# Patient Record
Sex: Female | Born: 1985 | Race: Black or African American | Hispanic: Yes | Smoking: Never smoker
Health system: Southern US, Community
[De-identification: ages and names within clinical notes are randomized; demographics above are authoritative.]

## PROBLEM LIST (undated history)

## (undated) DIAGNOSIS — K219 Gastro-esophageal reflux disease without esophagitis: Secondary | ICD-10-CM

## (undated) DIAGNOSIS — T7840XA Allergy, unspecified, initial encounter: Secondary | ICD-10-CM

## (undated) HISTORY — PX: EYE SURGERY: SHX253

## (undated) HISTORY — PX: NO PAST SURGERIES: SHX2092

## (undated) HISTORY — DX: Gastro-esophageal reflux disease without esophagitis: K21.9

## (undated) HISTORY — DX: Allergy, unspecified, initial encounter: T78.40XA

## (undated) HISTORY — PX: RETINAL DETACHMENT SURGERY: SHX105

---

## 2013-05-12 ENCOUNTER — Encounter: Payer: Self-pay | Admitting: Family

## 2013-05-12 ENCOUNTER — Ambulatory Visit (INDEPENDENT_AMBULATORY_CARE_PROVIDER_SITE_OTHER): Payer: 59 | Admitting: Family

## 2013-05-12 VITALS — BP 110/80 | HR 85 | Ht 64.5 in | Wt 225.0 lb

## 2013-05-12 DIAGNOSIS — Z0000 Encounter for general adult medical examination without abnormal findings: Secondary | ICD-10-CM

## 2013-05-12 DIAGNOSIS — E559 Vitamin D deficiency, unspecified: Secondary | ICD-10-CM | POA: Insufficient documentation

## 2013-05-12 LAB — POCT URINALYSIS DIPSTICK
Blood, UA: NEGATIVE
Ketones, UA: NEGATIVE
Protein, UA: NEGATIVE
Spec Grav, UA: 1.02
Urobilinogen, UA: 0.2
pH, UA: 6.5

## 2013-05-12 LAB — CBC WITH DIFFERENTIAL/PLATELET
Eosinophils Absolute: 0 10*3/uL (ref 0.0–0.7)
Eosinophils Relative: 0.5 % (ref 0.0–5.0)
Lymphocytes Relative: 34.5 % (ref 12.0–46.0)
Monocytes Relative: 7.3 % (ref 3.0–12.0)
Neutrophils Relative %: 57.4 % (ref 43.0–77.0)
Platelets: 237 10*3/uL (ref 150.0–400.0)
RDW: 14.1 % (ref 11.5–14.6)
WBC: 4.1 10*3/uL — ABNORMAL LOW (ref 4.5–10.5)

## 2013-05-12 LAB — COMPREHENSIVE METABOLIC PANEL
Albumin: 3.6 g/dL (ref 3.5–5.2)
Alkaline Phosphatase: 77 U/L (ref 39–117)
BUN: 9 mg/dL (ref 6–23)
CO2: 27 mEq/L (ref 19–32)
Calcium: 9.2 mg/dL (ref 8.4–10.5)
GFR: 123.68 mL/min (ref >60.00)
Glucose, Bld: 82 mg/dL (ref 70–99)
Potassium: 4.2 mEq/L (ref 3.5–5.1)
Sodium: 136 mEq/L (ref 135–145)
Total Bilirubin: 1.1 mg/dL (ref 0.3–1.2)
Total Protein: 7.5 g/dL (ref 6.0–8.3)

## 2013-05-12 LAB — TSH: TSH: 0.66 u[IU]/mL (ref 0.35–5.50)

## 2013-05-12 LAB — LIPID PANEL
HDL: 53.1 mg/dL (ref >39.00)
LDL Cholesterol: 93 mg/dL (ref 0–99)
Total CHOL/HDL Ratio: 3
VLDL: 10.6 mg/dL (ref 0.0–40.0)

## 2013-05-12 NOTE — Progress Notes (Signed)
  Subjective:    Patient ID: Savannah Holland, female    DOB: May 09, 1986, 27 y.o.   MRN: [id number]  HPI 27 year old [place] American female, new patient to the practice and to be established. She denies any concerns today. She sees gynecology for her female exams. Has a history of vitamin D deficiency and was prescribed medication for gynecology but has not taken the medication. She does not exercise routinely but tries to follow a healthy diet and considering Weight Watchers.   Review of Systems  Constitutional: Negative.   HENT: Negative.   Eyes: Negative.   Respiratory: Negative.   Cardiovascular: Negative.   Gastrointestinal: Negative.   Endocrine: Negative.   Genitourinary: Negative.   Musculoskeletal: Negative.   Skin: Negative.   Allergic/Immunologic: Negative.   Neurological: Negative.   Hematological: Negative.   Psychiatric/Behavioral: Negative.    Past Medical History  Diagnosis Date  . Allergy   . GERD (gastroesophageal reflux disease)     History   Social History  . Marital Status: Married    Spouse Name: N/A    Number of Children: N/A  . Years of Education: N/A   Occupational History  . Not on file.   Social History Main Topics  . Smoking status: Never Smoker   . Smokeless tobacco: Not on file  . Alcohol Use: Yes  . Drug Use: No  . Sexual Activity: Not on file   Other Topics Concern  . Not on file   Social History Narrative  . No narrative on file    History reviewed. No pertinent past surgical history.  Family History  Problem Relation Age of Onset  . Fibroids Mother   . Hyperlipidemia Father   . GER disease Father   . Prostate cancer Father     No Known Allergies  No current outpatient prescriptions on file prior to visit.   No current facility-administered medications on file prior to visit.    BP 110/80  Pulse 85  Ht 5' 4.5" (1.638 m)  Wt 225 lb (102.059 kg)  BMI 38.04 kg/m2  LMP 11/01/2014chart    Objective:   Physical  Exam  Constitutional: She is oriented to person, place, and time. She appears well-developed and well-nourished.  HENT:  Head: Normocephalic and atraumatic.  Right Ear: External ear normal.  Left Ear: External ear normal.  Nose: Nose normal.  Mouth/Throat: Oropharynx is clear and moist.  Eyes: Conjunctivae and EOM are normal. Pupils are equal, round, and reactive to light.  Neck: Normal range of motion. Neck supple. No thyromegaly present.  Cardiovascular: Normal rate, regular rhythm and normal heart sounds.   Pulmonary/Chest: Effort normal and breath sounds normal.  Abdominal: Soft. Bowel sounds are normal. She exhibits no distension. There is no tenderness. There is no rebound.  Musculoskeletal: Normal range of motion. She exhibits no edema and no tenderness.  Neurological: She is alert and oriented to person, place, and time. She has normal reflexes. She displays normal reflexes. No cranial nerve deficit. Coordination normal.  Skin: Skin is warm and dry.  Psychiatric: She has a normal mood and affect.          Assessment & Plan:  Assessment: 1. Complete physical exam 2. Obesity 3. Vitamin D deficiency  Plan: Encouraged healthy diet, exercise, monthly self breast exams. Labs sent today to include CMP, lipids, CBC, TSH, and UA will notify patient of results. We'll follow up with patient in one year and sooner as needed.

## 2013-05-12 NOTE — Patient Instructions (Signed)
Vitamin D Deficiency  Vitamin D is an important vitamin that your body needs. Having too little of it in your body is called a deficiency. A very bad deficiency can make your bones soft and can cause a condition called rickets.   Vitamin D is important to your body for different reasons, such as:   · It helps your body absorb 2 minerals called calcium and phosphorus.  · It helps make your bones healthy.  · It may prevent some diseases, such as diabetes and multiple sclerosis.  · It helps your muscles and heart.  You can get vitamin D in several ways. It is a natural part of some foods. The vitamin is also added to some dairy products and cereals. Some people take vitamin D supplements. Also, your body makes vitamin D when you are in the sun. It changes the sun's rays into a form of the vitamin that your body can use.  CAUSES   · Not eating enough foods that contain vitamin D.  · Not getting enough sunlight.  · Having certain digestive system diseases that make it hard to absorb vitamin D. These diseases include Crohn's disease, chronic pancreatitis, and cystic fibrosis.  · Having a surgery in which part of the stomach or small intestine is removed.  · Being obese. Fat cells pull vitamin D out of your blood. That means that obese people may not have enough vitamin D left in their blood and in other body tissues.  · Having chronic kidney or liver disease.  RISK FACTORS  Risk factors are things that make you more likely to develop a vitamin D deficiency. They include:  · Being older.  · Not being able to get outside very much.  · Living in a nursing home.  · Having had broken bones.  · Having weak or thin bones (osteoporosis).  · Having a disease or condition that changes how your body absorbs vitamin D.  · Having dark skin.  · Some medicines such as seizure medicines or steroids.  · Being overweight or obese.  SYMPTOMS  Mild cases of vitamin D deficiency may not have any symptoms. If you have a very bad case, symptoms  may include:  · Bone pain.  · Muscle pain.  · Falling often.  · Broken bones caused by a minor injury, due to osteoporosis.  DIAGNOSIS  A blood test is the best way to tell if you have a vitamin D deficiency.  TREATMENT  Vitamin D deficiency can be treated in different ways. Treatment for vitamin D deficiency depends on what is causing it. Options include:  · Taking vitamin D supplements.  · Taking a calcium supplement. Your caregiver will suggest what dose is best for you.  HOME CARE INSTRUCTIONS  · Take any supplements that your caregiver prescribes. Follow the directions carefully. Take only the suggested amount.  · Have your blood tested 2 months after you start taking supplements.  · Eat foods that contain vitamin D. Healthy choices include:  · Fortified dairy products, cereals, or juices. Fortified means vitamin D has been added to the food. Check the label on the package to be sure.  · Fatty fish like salmon or trout.  · Eggs.  · Oysters.  · Do not use a tanning bed.  · Keep your weight at a healthy level. Lose weight if you need to.  · Keep all follow-up appointments. Your caregiver will need to perform blood tests to make sure your vitamin D deficiency   is going away.  SEEK MEDICAL CARE IF:  · You have any questions about your treatment.  · You continue to have symptoms of vitamin D deficiency.  · You have nausea or vomiting.  · You are constipated.  · You feel confused.  · You have severe abdominal or back pain.  MAKE SURE YOU:  · Understand these instructions.  · Will watch your condition.  · Will get help right away if you are not doing well or get worse.  Document Released: 08/26/2011 Document Revised: 09/28/2012 Document Reviewed: 08/26/2011  [employer]® Patient Information ©2014 [employer].

## 2014-08-19 ENCOUNTER — Ambulatory Visit (INDEPENDENT_AMBULATORY_CARE_PROVIDER_SITE_OTHER): Payer: 59 | Admitting: Family Medicine

## 2014-08-19 ENCOUNTER — Encounter: Payer: Self-pay | Admitting: Family Medicine

## 2014-08-19 VITALS — BP 120/88 | HR 86 | Temp 98.7°F | Ht 65.0 in | Wt 227.0 lb

## 2014-08-19 DIAGNOSIS — E669 Obesity, unspecified: Secondary | ICD-10-CM | POA: Insufficient documentation

## 2014-08-19 DIAGNOSIS — E559 Vitamin D deficiency, unspecified: Secondary | ICD-10-CM

## 2014-08-19 DIAGNOSIS — Z0000 Encounter for general adult medical examination without abnormal findings: Secondary | ICD-10-CM

## 2014-08-19 LAB — LIPID PANEL
Cholesterol: 149 mg/dL (ref 0–200)
HDL: 48.3 mg/dL (ref >39.00)
LDL Cholesterol: 86 mg/dL (ref 0–99)
NonHDL: 100.7
TRIGLYCERIDES: 74 mg/dL (ref 0.0–149.0)
Total CHOL/HDL Ratio: 3
VLDL: 14.8 mg/dL (ref 0.0–40.0)

## 2014-08-19 LAB — VITAMIN D 25 HYDROXY (VIT D DEFICIENCY, FRACTURES): VITD: 15.73 ng/mL — ABNORMAL LOW (ref 30.00–100.00)

## 2014-08-19 LAB — HEMOGLOBIN A1C: HEMOGLOBIN A1C: 5.5 % (ref 4.6–6.5)

## 2014-08-19 NOTE — Patient Instructions (Addendum)
BEFORE YOU LEAVE: -labs -schedule CPE with pap for next year  Vit D3 [phone] IU; folic acid 400mcg daily  We recommend the following healthy lifestyle measures: - eat a healthy diet consisting of lots of vegetables, fruits, beans, nuts, seeds, healthy meats such as white chicken and fish and whole grains.  - avoid fried foods, fast food, processed foods, sodas, red meet and other fattening foods.  - get a least 150 minutes of aerobic exercise per week.   -We have ordered labs or studies at this visit. It can take up to 1-2 weeks for results and processing. We will contact you with instructions IF your results are abnormal. Normal results will be [facility]. If you have not heard from us or can not find your results in [facility] in 2 weeks please contact our office.

## 2014-08-19 NOTE — Progress Notes (Signed)
Pre visit review using our clinic review tool, if applicable. No additional management support is needed unless otherwise documented below in the visit note. 

## 2014-08-19 NOTE — Progress Notes (Signed)
HPI:  Savannah Holland is a 29 yo F pt of Adline Mangoad[facility] Campbell here to establish care and for her CPE:  -Concerns and/or follow up today:   Hx of Vit D def: -takes 1000 IU of D3 sometimes -does not get any sun -denies: diarrhea, kidney stones, bone disorders  Obesity: -Diet: variety of foods, balance and well rounded - poor at times -Exercise: goes to the gyn 2 days week  Seasonal Allergies: -takes OTC medication prn -no hx asthma, breathing difficulty  GERD: -TUMS prn -has improved -denies: dysphagia, nausea, vomiting  -Taking folic acid, vitamin D: takes vit D sometimes  -Diabetes and Dyslipidemia Screening: FASTING today  -Hx of HTN: no  -Vaccines: Tdap, influenza offered  -pap history: sees gyn - reports last pap 07/2012 - reports all normal paps in the last 10 years, reports not due today  -FDLMP: 2 weeks ago  -sexual activity: yes, female partner, no new partners  -wants STI testing: declined other then HIV  -FH breast, colon or ovarian ca: see FH Last mammogram: n/a Last colon cancer screening:n/a  -Alcohol, Tobacco, drug use: see social history  Review of Systems - no fevers, unintentional weight loss, vision loss, hearing loss, chest pain, sob, hemoptysis, melena, hematochezia, hematuria, genital discharge, changing or concerning skin lesions, bleeding, bruising, loc, thoughts of self harm or SI  Past Medical History  Diagnosis Date  . Allergy   . GERD (gastroesophageal reflux disease)     History reviewed. No pertinent past surgical history.  Family History  Problem Relation Age of Onset  . Fibroids Mother   . Hyperlipidemia Father   . GER disease Father   . Prostate cancer Father     History   Social History  . Marital Status: Married    Spouse Name: N/A  . Number of Children: N/A  . Years of Education: N/A   Social History Main Topics  . Smoking status: Never Smoker   . Smokeless tobacco: Not on file  . Alcohol Use: Yes     Comment:  rare  . Drug Use: No  . Sexual Activity: Not on file   Other Topics Concern  . None   Social History Narrative   Work or School: MA at [employer]      Home Situation: lives with husband and daughter (3 yo)      Spiritual Beliefs: [employer]      Lifestyle: gym 2 days per week; diet is good sometimes, bad sometimes          No current outpatient prescriptions on file.  EXAM:  Filed Vitals:   08/19/14 0817  BP: 120/88  Pulse: 86  Temp: 98.7 F (37.1 C)    GENERAL: vitals reviewed and listed below, alert, oriented, appears well hydrated and in no acute distress  HEENT: head atraumatic, PERRLA, normal appearance of eyes, ears, nose and mouth. moist mucus membranes.  NECK: supple, no masses or lymphadenopathy  LUNGS: clear to auscultation bilaterally, no rales, rhonchi or wheeze  CV: HRRR, no peripheral edema or cyanosis, normal pedal pulses  BREAST: declined  ABDOMEN: bowel sounds normal, soft, non tender to palpation, no masses, no rebound or guarding  GU: declined  RECTAL: deferred  SKIN: no rash or abnormal lesions  MS: normal gait, moves all extremities normally  NEURO: CN II-XII grossly intact, normal muscle strength and sensation to light touch on extremities  PSYCH: normal affect, pleasant and cooperative  ASSESSMENT AND PLAN:  Discussed the following assessment and  plan:  Visit for preventive health examination - Plan: Lipid Panel, Hemoglobin A1c, HIV antibody (with reflex)  Obesity  Vitamin D deficiency - Plan: Vitamin D, 25-hydroxy  -Discussed and advised all [place] preventive services health task force level A and B recommendations for age, sex and risks.  -Advised at least 150 minutes of exercise per week and a healthy diet low in saturated fats and sweets and consisting of fresh fruits and vegetables, lean meats such as fish and white chicken and whole grains.  -FASTING labs, studies and vaccines per orders this  encounter  Orders Placed This Encounter  Procedures  . Lipid Panel  . Hemoglobin A1c  . Vitamin D, 25-hydroxy  . HIV antibody (with reflex)    Patient advised to return to clinic immediately if symptoms worsen or persist or new concerns.  Patient Instructions  Vit D3 [phone] IU; folic acid daily      No Follow-up on file.  Kriste Basque R.

## 2014-08-20 LAB — HIV ANTIBODY (ROUTINE TESTING W REFLEX): HIV 1&2 Ab, 4th Generation: NONREACTIVE

## 2015-06-02 ENCOUNTER — Ambulatory Visit: Payer: 59 | Admitting: Family Medicine

## 2015-06-22 ENCOUNTER — Encounter: Payer: 59 | Admitting: Family Medicine

## 2015-07-12 ENCOUNTER — Encounter: Payer: 59 | Admitting: Family Medicine

## 2015-11-29 ENCOUNTER — Encounter: Payer: Self-pay | Admitting: Family Medicine

## 2015-12-01 ENCOUNTER — Encounter: Payer: Self-pay | Admitting: Family Medicine

## 2015-12-01 ENCOUNTER — Other Ambulatory Visit: Payer: Self-pay | Admitting: Family Medicine

## 2015-12-01 ENCOUNTER — Ambulatory Visit (INDEPENDENT_AMBULATORY_CARE_PROVIDER_SITE_OTHER): Payer: 59 | Admitting: Family Medicine

## 2015-12-01 VITALS — BP 113/76 | HR 85 | Resp 16 | Ht 64.5 in | Wt 239.0 lb

## 2015-12-01 DIAGNOSIS — E669 Obesity, unspecified: Secondary | ICD-10-CM | POA: Diagnosis not present

## 2015-12-01 DIAGNOSIS — Z1151 Encounter for screening for human papillomavirus (HPV): Secondary | ICD-10-CM | POA: Diagnosis not present

## 2015-12-01 DIAGNOSIS — Z124 Encounter for screening for malignant neoplasm of cervix: Secondary | ICD-10-CM | POA: Diagnosis not present

## 2015-12-01 DIAGNOSIS — Z30431 Encounter for routine checking of intrauterine contraceptive device: Secondary | ICD-10-CM

## 2015-12-01 DIAGNOSIS — Z01419 Encounter for gynecological examination (general) (routine) without abnormal findings: Secondary | ICD-10-CM

## 2015-12-01 LAB — COMPREHENSIVE METABOLIC PANEL
ALBUMIN: 4 g/dL (ref 3.6–5.1)
ALT: 15 U/L (ref 6–29)
AST: 18 U/L (ref 10–30)
Alkaline Phosphatase: 106 U/L (ref 33–115)
BUN: 13 mg/dL (ref 7–25)
CO2: 25 mmol/L (ref 20–31)
Calcium: 9.4 mg/dL (ref 8.6–10.2)
Chloride: 102 mmol/L (ref 98–110)
Creat: 0.81 mg/dL (ref 0.50–1.10)
Glucose, Bld: 84 mg/dL (ref 65–99)
Potassium: 4.2 mmol/L (ref 3.5–5.3)
Sodium: 135 mmol/L (ref 135–146)
Total Bilirubin: 0.6 mg/dL (ref 0.2–1.2)
Total Protein: 7.4 g/dL (ref 6.1–8.1)

## 2015-12-01 LAB — TSH: TSH: 1.08 m[IU]/L

## 2015-12-01 LAB — CBC
HCT: 40.2 % (ref 35.0–45.0)
HEMOGLOBIN: 12.9 g/dL (ref 11.7–15.5)
MCH: 26.6 pg — ABNORMAL LOW (ref 27.0–33.0)
MCHC: 32.1 g/dL (ref 32.0–36.0)
MCV: 82.9 fL (ref 80.0–100.0)
MPV: 9.8 fL (ref 7.5–12.5)
Platelets: 304 10*3/uL (ref 140–400)
RBC: 4.85 MIL/uL (ref 3.80–5.10)
RDW: 15.1 % — ABNORMAL HIGH (ref 11.0–15.0)
WBC: 4.9 10*3/uL (ref 3.8–10.8)

## 2015-12-01 LAB — LIPID PANEL
Cholesterol: 196 mg/dL (ref 125–200)
HDL: 68 mg/dL (ref >=46)
LDL Cholesterol: 112 mg/dL (ref <130)
TRIGLYCERIDES: 79 mg/dL (ref <150)
Total CHOL/HDL Ratio: 2.9 Ratio (ref <=5.0)
VLDL: 16 mg/dL (ref <30)

## 2015-12-01 MED ORDER — PHENTERMINE HCL 37.5 MG PO CAPS: 37.5000 mg | ORAL_CAPSULE | ORAL | Status: DC

## 2015-12-01 NOTE — Patient Instructions (Signed)
Preventive Care for Adults, Female A healthy lifestyle and preventive care can promote health and wellness. Preventive health guidelines for women include the following key practices.  A routine yearly physical is a good way to check with your health care provider about your health and preventive screening. It is a chance to share any concerns and updates on your health and to receive a thorough exam.  Visit your dentist for a routine exam and preventive care every 6 months. Brush your teeth twice a day and floss once a day. Good oral hygiene prevents tooth decay and gum disease.  The frequency of eye exams is based on your age, health, family medical history, use of contact lenses, and other factors. Follow your health care provider's recommendations for frequency of eye exams.  Eat a healthy diet. Foods like vegetables, fruits, whole grains, low-fat dairy products, and lean protein foods contain the nutrients you need without too many calories. Decrease your intake of foods high in solid fats, added sugars, and salt. Eat the right amount of calories for you.Get information about a proper diet from your health care provider, if necessary.  Regular physical exercise is one of the most important things you can do for your health. Most adults should get at least 150 minutes of moderate-intensity exercise (any activity that increases your heart rate and causes you to sweat) each week. In addition, most adults need muscle-strengthening exercises on 2 or more days a week.  Maintain a healthy weight. The body mass index (BMI) is a screening tool to identify possible weight problems. It provides an estimate of body fat based on height and weight. Your health care provider can find your BMI and can help you achieve or maintain a healthy weight.For adults 20 years and older:  A BMI below 18.5 is considered underweight.  A BMI of 18.5 to 24.9 is normal.  A BMI of 25 to 29.9 is considered overweight.  A  BMI of 30 and above is considered obese.  Maintain normal blood lipids and cholesterol levels by exercising and minimizing your intake of saturated fat. Eat a balanced diet with plenty of fruit and vegetables. Blood tests for lipids and cholesterol should begin at age 45 and be repeated every 5 years. If your lipid or cholesterol levels are high, you are over 50, or you are at high risk for heart disease, you may need your cholesterol levels checked more frequently.Ongoing high lipid and cholesterol levels should be treated with medicines if diet and exercise are not working.  If you smoke, find out from your health care provider how to quit. If you do not use tobacco, do not start.  Lung cancer screening is recommended for adults aged 45-80 years who are at high risk for developing lung cancer because of a history of smoking. A yearly low-dose CT scan of the lungs is recommended for people who have at least a 30-pack-year history of smoking and are a current smoker or have quit within the past 15 years. A pack year of smoking is smoking an average of 1 pack of cigarettes a day for 1 year (for example: 1 pack a day for 30 years or 2 packs a day for 15 years). Yearly screening should continue until the smoker has stopped smoking for at least 15 years. Yearly screening should be stopped for people who develop a health problem that would prevent them from having lung cancer treatment.  If you are pregnant, do not drink alcohol. If you are  breastfeeding, be very cautious about drinking alcohol. If you are not pregnant and choose to drink alcohol, do not have more than 1 drink per day. One drink is considered to be 12 ounces (355 mL) of beer, 5 ounces (148 mL) of wine, or 1.5 ounces (44 mL) of liquor.  Avoid use of street drugs. Do not share needles with anyone. Ask for help if you need support or instructions about stopping the use of drugs.  High blood pressure causes heart disease and increases the risk  of stroke. Your blood pressure should be checked at least every 1 to 2 years. Ongoing high blood pressure should be treated with medicines if weight loss and exercise do not work.  If you are 55-79 years old, ask your health care provider if you should take aspirin to prevent strokes.  Diabetes screening is done by taking a blood sample to check your blood glucose level after you have not eaten for a certain period of time (fasting). If you are not overweight and you do not have risk factors for diabetes, you should be screened once every 3 years starting at age 45. If you are overweight or obese and you are 40-70 years of age, you should be screened for diabetes every year as part of your cardiovascular risk assessment.  Breast cancer screening is essential preventive care for women. You should practice "breast self-awareness." This means understanding the normal appearance and feel of your breasts and may include breast self-examination. Any changes detected, no matter how small, should be reported to a health care provider. Women in their 20s and 30s should have a clinical breast exam (CBE) by a health care provider as part of a regular health exam every 1 to 3 years. After age 40, women should have a CBE every year. Starting at age 40, women should consider having a mammogram (breast X-ray test) every year. Women who have a family history of breast cancer should talk to their health care provider about genetic screening. Women at a high risk of breast cancer should talk to their health care providers about having an MRI and a mammogram every year.  Breast cancer gene (BRCA)-related cancer risk assessment is recommended for women who have family members with BRCA-related cancers. BRCA-related cancers include breast, ovarian, tubal, and peritoneal cancers. Having family members with these cancers may be associated with an increased risk for harmful changes (mutations) in the breast cancer genes BRCA1 and  BRCA2. Results of the assessment will determine the need for genetic counseling and BRCA1 and BRCA2 testing.  Your health care provider may recommend that you be screened regularly for cancer of the pelvic organs (ovaries, uterus, and vagina). This screening involves a pelvic examination, including checking for microscopic changes to the surface of your cervix (Pap test). You may be encouraged to have this screening done every 3 years, beginning at age 21.  For women ages 30-65, health care providers may recommend pelvic exams and Pap testing every 3 years, or they may recommend the Pap and pelvic exam, combined with testing for human papilloma virus (HPV), every 5 years. Some types of HPV increase your risk of cervical cancer. Testing for HPV may also be done on women of any age with unclear Pap test results.  Other health care providers may not recommend any screening for nonpregnant women who are considered low risk for pelvic cancer and who do not have symptoms. Ask your health care provider if a screening pelvic exam is right for   you.  If you have had past treatment for cervical cancer or a condition that could lead to cancer, you need Pap tests and screening for cancer for at least 20 years after your treatment. If Pap tests have been discontinued, your risk factors (such as having a new sexual partner) need to be reassessed to determine if screening should resume. Some women have medical problems that increase the chance of getting cervical cancer. In these cases, your health care provider may recommend more frequent screening and Pap tests.  Colorectal cancer can be detected and often prevented. Most routine colorectal cancer screening begins at the age of 50 years and continues through age 75 years. However, your health care provider may recommend screening at an earlier age if you have risk factors for colon cancer. On a yearly basis, your health care provider may provide home test kits to check  for hidden blood in the stool. Use of a small camera at the end of a tube, to directly examine the colon (sigmoidoscopy or colonoscopy), can detect the earliest forms of colorectal cancer. Talk to your health care provider about this at age 50, when routine screening begins. Direct exam of the colon should be repeated every 5-10 years through age 75 years, unless early forms of precancerous polyps or small growths are found.  People who are at an increased risk for hepatitis B should be screened for this virus. You are considered at high risk for hepatitis B if:  You were born in a country where hepatitis B occurs often. Talk with your health care provider about which countries are considered high risk.  Your parents were born in a high-risk country and you have not received a shot to protect against hepatitis B (hepatitis B vaccine).  You have HIV or AIDS.  You use needles to inject street drugs.  You live with, or have sex with, someone who has hepatitis B.  You get hemodialysis treatment.  You take certain medicines for conditions like cancer, organ transplantation, and autoimmune conditions.  Hepatitis C blood testing is recommended for all people born from 1945 through 1965 and any individual with known risks for hepatitis C.  Practice safe sex. Use condoms and avoid high-risk sexual practices to reduce the spread of sexually transmitted infections (STIs). STIs include gonorrhea, chlamydia, syphilis, trichomonas, herpes, HPV, and human immunodeficiency virus (HIV). Herpes, HIV, and HPV are viral illnesses that have no cure. They can result in disability, cancer, and death.  You should be screened for sexually transmitted illnesses (STIs) including gonorrhea and chlamydia if:  You are sexually active and are younger than 24 years.  You are older than 24 years and your health care provider tells you that you are at risk for this type of infection.  Your sexual activity has changed  since you were last screened and you are at an increased risk for chlamydia or gonorrhea. Ask your health care provider if you are at risk.  If you are at risk of being infected with HIV, it is recommended that you take a prescription medicine daily to prevent HIV infection. This is called preexposure prophylaxis (PrEP). You are considered at risk if:  You are sexually active and do not regularly use condoms or know the HIV status of your partner(s).  You take drugs by injection.  You are sexually active with a partner who has HIV.  Talk with your health care provider about whether you are at high risk of being infected with HIV. If   you choose to begin PrEP, you should first be tested for HIV. You should then be tested every 3 months for as long as you are taking PrEP.  Osteoporosis is a disease in which the bones lose minerals and strength with aging. This can result in serious bone fractures or breaks. The risk of osteoporosis can be identified using a bone density scan. Women ages 67 years and over and women at risk for fractures or osteoporosis should discuss screening with their health care providers. Ask your health care provider whether you should take a calcium supplement or vitamin D to reduce the rate of osteoporosis.  Menopause can be associated with physical symptoms and risks. Hormone replacement therapy is available to decrease symptoms and risks. You should talk to your health care provider about whether hormone replacement therapy is right for you.  Use sunscreen. Apply sunscreen liberally and repeatedly throughout the day. You should seek shade when your shadow is shorter than you. Protect yourself by wearing long sleeves, pants, a wide-brimmed hat, and sunglasses year round, whenever you are outdoors.  Once a month, do a whole body skin exam, using a mirror to look at the skin on your back. Tell your health care provider of new moles, moles that have irregular borders, moles that  are larger than a pencil eraser, or moles that have changed in shape or color.  Stay current with required vaccines (immunizations).  Influenza vaccine. All adults should be immunized every year.  Tetanus, diphtheria, and acellular pertussis (Td, Tdap) vaccine. Pregnant women should receive 1 dose of Tdap vaccine during each pregnancy. The dose should be obtained regardless of the length of time since the last dose. Immunization is preferred during the 27th-36th week of gestation. An adult who has not previously received Tdap or who does not know her vaccine status should receive 1 dose of Tdap. This initial dose should be followed by tetanus and diphtheria toxoids (Td) booster doses every 10 years. Adults with an unknown or incomplete history of completing a 3-dose immunization series with Td-containing vaccines should begin or complete a primary immunization series including a Tdap dose. Adults should receive a Td booster every 10 years.  Varicella vaccine. An adult without evidence of immunity to varicella should receive 2 doses or a second dose if she has previously received 1 dose. Pregnant females who do not have evidence of immunity should receive the first dose after pregnancy. This first dose should be obtained before leaving the health care facility. The second dose should be obtained 4-8 weeks after the first dose.  Human papillomavirus (HPV) vaccine. Females aged 13-26 years who have not received the vaccine previously should obtain the 3-dose series. The vaccine is not recommended for use in pregnant females. However, pregnancy testing is not needed before receiving a dose. If a female is found to be pregnant after receiving a dose, no treatment is needed. In that case, the remaining doses should be delayed until after the pregnancy. Immunization is recommended for any person with an immunocompromised condition through the age of 61 years if she did not get any or all doses earlier. During the  3-dose series, the second dose should be obtained 4-8 weeks after the first dose. The third dose should be obtained 24 weeks after the first dose and 16 weeks after the second dose.  Zoster vaccine. One dose is recommended for adults aged 30 years or older unless certain conditions are present.  Measles, mumps, and rubella (MMR) vaccine. Adults born  before 1957 generally are considered immune to measles and mumps. Adults born in 1957 or later should have 1 or more doses of MMR vaccine unless there is a contraindication to the vaccine or there is laboratory evidence of immunity to each of the three diseases. A routine second dose of MMR vaccine should be obtained at least 28 days after the first dose for students attending postsecondary schools, health care workers, or international travelers. People who received inactivated measles vaccine or an unknown type of measles vaccine during 1963-1967 should receive 2 doses of MMR vaccine. People who received inactivated mumps vaccine or an unknown type of mumps vaccine before 1979 and are at high risk for mumps infection should consider immunization with 2 doses of MMR vaccine. For females of childbearing age, rubella immunity should be determined. If there is no evidence of immunity, females who are not pregnant should be vaccinated. If there is no evidence of immunity, females who are pregnant should delay immunization until after pregnancy. Unvaccinated health care workers born before 1957 who lack laboratory evidence of measles, mumps, or rubella immunity or laboratory confirmation of disease should consider measles and mumps immunization with 2 doses of MMR vaccine or rubella immunization with 1 dose of MMR vaccine.  Pneumococcal 13-valent conjugate (PCV13) vaccine. When indicated, a person who is uncertain of his immunization history and has no record of immunization should receive the PCV13 vaccine. All adults 65 years of age and older should receive this  vaccine. An adult aged 19 years or older who has certain medical conditions and has not been previously immunized should receive 1 dose of PCV13 vaccine. This PCV13 should be followed with a dose of pneumococcal polysaccharide (PPSV23) vaccine. Adults who are at high risk for pneumococcal disease should obtain the PPSV23 vaccine at least 8 weeks after the dose of PCV13 vaccine. Adults older than 31 years of age who have normal immune system function should obtain the PPSV23 vaccine dose at least 1 year after the dose of PCV13 vaccine.  Pneumococcal polysaccharide (PPSV23) vaccine. When PCV13 is also indicated, PCV13 should be obtained first. All adults aged 65 years and older should be immunized. An adult younger than age 65 years who has certain medical conditions should be immunized. Any person who resides in a nursing home or long-term care facility should be immunized. An adult smoker should be immunized. People with an immunocompromised condition and certain other conditions should receive both PCV13 and PPSV23 vaccines. People with human immunodeficiency virus (HIV) infection should be immunized as soon as possible after diagnosis. Immunization during chemotherapy or radiation therapy should be avoided. Routine use of PPSV23 vaccine is not recommended for American Indians, [place] Natives, or people younger than 65 years unless there are medical conditions that require PPSV23 vaccine. When indicated, people who have unknown immunization and have no record of immunization should receive PPSV23 vaccine. One-time revaccination 5 years after the first dose of PPSV23 is recommended for people aged 19-64 years who have chronic kidney failure, nephrotic syndrome, asplenia, or immunocompromised conditions. People who received 1-2 doses of PPSV23 before age 65 years should receive another dose of PPSV23 vaccine at age 65 years or later if at least 5 years have passed since the previous dose. Doses of PPSV23 are not  needed for people immunized with PPSV23 at or after age 65 years.  Meningococcal vaccine. Adults with asplenia or persistent complement component deficiencies should receive 2 doses of quadrivalent meningococcal conjugate (MenACWY-D) vaccine. The doses should be obtained   at least 2 months apart. Microbiologists working with certain meningococcal bacteria, [place] recruits, people at risk during an outbreak, and people who travel to or live in countries with a high rate of meningitis should be immunized. A first-year college student up through age 34 years who is living in a residence hall should receive a dose if she did not receive a dose on or after her 16th birthday. Adults who have certain high-risk conditions should receive one or more doses of vaccine.  Hepatitis A vaccine. Adults who wish to be protected from this disease, have certain high-risk conditions, work with hepatitis A-infected animals, work in hepatitis A research labs, or travel to or work in countries with a high rate of hepatitis A should be immunized. Adults who were previously unvaccinated and who anticipate close contact with an international adoptee during the first 60 days after arrival in the [place] from a country with a high rate of hepatitis A should be immunized.  Hepatitis B vaccine. Adults who wish to be protected from this disease, have certain high-risk conditions, may be exposed to blood or other infectious body fluids, are household contacts or sex partners of hepatitis B positive people, are clients or workers in certain care facilities, or travel to or work in countries with a high rate of hepatitis B should be immunized.  Haemophilus influenzae type b (Hib) vaccine. A previously unvaccinated person with asplenia or sickle cell disease or having a scheduled splenectomy should receive 1 dose of Hib vaccine. Regardless of previous immunization, a recipient of a hematopoietic stem cell transplant should receive a  3-dose series 6-12 months after her successful transplant. Hib vaccine is not recommended for adults with HIV infection. Preventive Services / Frequency Ages 35 to 4 years  Blood pressure check.** / Every 3-5 years.  Lipid and cholesterol check.** / Every 5 years beginning at age 60.  Clinical breast exam.** / Every 3 years for women in their 71s and 10s.  BRCA-related cancer risk assessment.** / For women who have family members with a BRCA-related cancer (breast, ovarian, tubal, or peritoneal cancers).  Pap test.** / Every 2 years from ages 76 through 26. Every 3 years starting at age 61 through age 76 or 93 with a history of 3 consecutive normal Pap tests.  HPV screening.** / Every 3 years from ages 37 through ages 60 to 51 with a history of 3 consecutive normal Pap tests.  Hepatitis C blood test.** / For any individual with known risks for hepatitis C.  Skin self-exam. / Monthly.  Influenza vaccine. / Every year.  Tetanus, diphtheria, and acellular pertussis (Tdap, Td) vaccine.** / Consult your health care provider. Pregnant women should receive 1 dose of Tdap vaccine during each pregnancy. 1 dose of Td every 10 years.  Varicella vaccine.** / Consult your health care provider. Pregnant females who do not have evidence of immunity should receive the first dose after pregnancy.  HPV vaccine. / 3 doses over 6 months, if 93 and younger. The vaccine is not recommended for use in pregnant females. However, pregnancy testing is not needed before receiving a dose.  Measles, mumps, rubella (MMR) vaccine.** / You need at least 1 dose of MMR if you were born in 1957 or later. You may also need a 2nd dose. For females of childbearing age, rubella immunity should be determined. If there is no evidence of immunity, females who are not pregnant should be vaccinated. If there is no evidence of immunity, females who are  pregnant should delay immunization until after pregnancy.  Pneumococcal  13-valent conjugate (PCV13) vaccine.** / Consult your health care provider.  Pneumococcal polysaccharide (PPSV23) vaccine.** / 1 to 2 doses if you smoke cigarettes or if you have certain conditions.  Meningococcal vaccine.** / 1 dose if you are age 68 to 8 years and a Market researcher living in a residence hall, or have one of several medical conditions, you need to get vaccinated against meningococcal disease. You may also need additional booster doses.  Hepatitis A vaccine.** / Consult your health care provider.  Hepatitis B vaccine.** / Consult your health care provider.  Haemophilus influenzae type b (Hib) vaccine.** / Consult your health care provider. Ages 7 to 53 years  Blood pressure check.** / Every year.  Lipid and cholesterol check.** / Every 5 years beginning at age 25 years.  Lung cancer screening. / Every year if you are aged 11-80 years and have a 30-pack-year history of smoking and currently smoke or have quit within the past 15 years. Yearly screening is stopped once you have quit smoking for at least 15 years or develop a health problem that would prevent you from having lung cancer treatment.  Clinical breast exam.** / Every year after age 48 years.  BRCA-related cancer risk assessment.** / For women who have family members with a BRCA-related cancer (breast, ovarian, tubal, or peritoneal cancers).  Mammogram.** / Every year beginning at age 41 years and continuing for as long as you are in good health. Consult with your health care provider.  Pap test.** / Every 3 years starting at age 65 years through age 37 or 70 years with a history of 3 consecutive normal Pap tests.  HPV screening.** / Every 3 years from ages 72 years through ages 60 to 40 years with a history of 3 consecutive normal Pap tests.  Fecal occult blood test (FOBT) of stool. / Every year beginning at age 21 years and continuing until age 5 years. You may not need to do this test if you get  a colonoscopy every 10 years.  Flexible sigmoidoscopy or colonoscopy.** / Every 5 years for a flexible sigmoidoscopy or every 10 years for a colonoscopy beginning at age 35 years and continuing until age 48 years.  Hepatitis C blood test.** / For all people born from [birth year] through 1965 and any individual with known risks for hepatitis C.  Skin self-exam. / Monthly.  Influenza vaccine. / Every year.  Tetanus, diphtheria, and acellular pertussis (Tdap/Td) vaccine.** / Consult your health care provider. Pregnant women should receive 1 dose of Tdap vaccine during each pregnancy. 1 dose of Td every 10 years.  Varicella vaccine.** / Consult your health care provider. Pregnant females who do not have evidence of immunity should receive the first dose after pregnancy.  Zoster vaccine.** / 1 dose for adults aged 30 years or older.  Measles, mumps, rubella (MMR) vaccine.** / You need at least 1 dose of MMR if you were born in 1957 or later. You may also need a second dose. For females of childbearing age, rubella immunity should be determined. If there is no evidence of immunity, females who are not pregnant should be vaccinated. If there is no evidence of immunity, females who are pregnant should delay immunization until after pregnancy.  Pneumococcal 13-valent conjugate (PCV13) vaccine.** / Consult your health care provider.  Pneumococcal polysaccharide (PPSV23) vaccine.** / 1 to 2 doses if you smoke cigarettes or if you have certain conditions.  Meningococcal vaccine.** /  Consult your health care provider.  Hepatitis A vaccine.** / Consult your health care provider.  Hepatitis B vaccine.** / Consult your health care provider.  Haemophilus influenzae type b (Hib) vaccine.** / Consult your health care provider. Ages 64 years and over  Blood pressure check.** / Every year.  Lipid and cholesterol check.** / Every 5 years beginning at age 23 years.  Lung cancer screening. / Every year if you  are aged 16-80 years and have a 30-pack-year history of smoking and currently smoke or have quit within the past 15 years. Yearly screening is stopped once you have quit smoking for at least 15 years or develop a health problem that would prevent you from having lung cancer treatment.  Clinical breast exam.** / Every year after age 74 years.  BRCA-related cancer risk assessment.** / For women who have family members with a BRCA-related cancer (breast, ovarian, tubal, or peritoneal cancers).  Mammogram.** / Every year beginning at age 44 years and continuing for as long as you are in good health. Consult with your health care provider.  Pap test.** / Every 3 years starting at age 58 years through age 22 or 39 years with 3 consecutive normal Pap tests. Testing can be stopped between 65 and 70 years with 3 consecutive normal Pap tests and no abnormal Pap or HPV tests in the past 10 years.  HPV screening.** / Every 3 years from ages 64 years through ages 70 or 61 years with a history of 3 consecutive normal Pap tests. Testing can be stopped between 65 and 70 years with 3 consecutive normal Pap tests and no abnormal Pap or HPV tests in the past 10 years.  Fecal occult blood test (FOBT) of stool. / Every year beginning at age 40 years and continuing until age 27 years. You may not need to do this test if you get a colonoscopy every 10 years.  Flexible sigmoidoscopy or colonoscopy.** / Every 5 years for a flexible sigmoidoscopy or every 10 years for a colonoscopy beginning at age 7 years and continuing until age 32 years.  Hepatitis C blood test.** / For all people born from [birth year] through 1966 and any individual with known risks for hepatitis C.  Osteoporosis screening.** / A one-time screening for women ages 30 years and over and women at risk for fractures or osteoporosis.  Skin self-exam. / Monthly.  Influenza vaccine. / Every year.  Tetanus, diphtheria, and acellular pertussis (Tdap/Td)  vaccine.** / 1 dose of Td every 10 years.  Varicella vaccine.** / Consult your health care provider.  Zoster vaccine.** / 1 dose for adults aged 35 years or older.  Pneumococcal 13-valent conjugate (PCV13) vaccine.** / Consult your health care provider.  Pneumococcal polysaccharide (PPSV23) vaccine.** / 1 dose for all adults aged 46 years and older.  Meningococcal vaccine.** / Consult your health care provider.  Hepatitis A vaccine.** / Consult your health care provider.  Hepatitis B vaccine.** / Consult your health care provider.  Haemophilus influenzae type b (Hib) vaccine.** / Consult your health care provider. ** Family history and personal history of risk and conditions may change your health care provider's recommendations.   This information is not intended to replace advice given to you by your health care provider. Make sure you discuss any questions you have with your health care provider.   Document Released: 07/30/2001 Document Revised: 06/24/2014 Document Reviewed: 10/29/2010 Elsevier Interactive Patient Education [employer].

## 2015-12-01 NOTE — Progress Notes (Signed)
Subjective:     Savannah BeamJazmin Knoop is a 30 y.o. female and is here for a comprehensive physical exam. The patient reports problems - vaginal discharge related to her IUD. IUD due for change in 03/2016. She is unsure about timing of other kids. Some marital issues right now. Would like to begin meds for weight loss.  Social History   Social History  . Marital Status: Married    Spouse Name: N/A  . Number of Children: N/A  . Years of Education: N/A   Occupational History  . Not on file.   Social History Main Topics  . Smoking status: Never Smoker   . Smokeless tobacco: Not on file  . Alcohol Use: Yes     Comment: rare  . Drug Use: No  . Sexual Activity: Yes    Birth Control/ Protection: IUD   Other Topics Concern  . Not on file   Social History Narrative   Work or School: MA at [employer]      Home Situation: lives with husband and daughter (3 yo)      Spiritual Beliefs: [employer]      Lifestyle: gym 2 days per week; diet is good sometimes, bad sometimes         Health Maintenance  Topic Date Due  . PAP SMEAR  12/14/2005  . INFLUENZA VACCINE  01/16/2016  . TETANUS/TDAP  06/17/2020  . HIV Screening  Completed    The following portions of the patient's history were reviewed and updated as appropriate: allergies, current medications, past family history, past medical history, past social history, past surgical history and problem list.  Review of Systems Pertinent items noted in HPI and remainder of comprehensive ROS otherwise negative.   Objective:    BP 113/76 mmHg  Pulse 85  Resp 16  Ht 5' 4.5" (1.638 m)  Wt 239 lb (108.41 kg)  BMI 40.41 kg/m2  LMP 11/17/2015 General appearance: alert, cooperative, appears stated age and moderately obese Head: Normocephalic, without obvious abnormality, atraumatic Neck: no adenopathy, supple, symmetrical, trachea midline and thyroid not enlarged, symmetric, no tenderness/mass/nodules Lungs: clear to  auscultation bilaterally Breasts: normal appearance, no masses or tenderness, fibrocystic changes bilaterally Heart: regular rate and rhythm, S1, S2 normal, no murmur, click, rub or gallop Abdomen: soft, non-tender; bowel sounds normal; no masses,  no organomegaly Pelvic: cervix normal in appearance, external genitalia normal, no adnexal masses or tenderness, no cervical motion tenderness, uterus normal size, shape, and consistency, vagina normal without discharge and IUD strings visualized Extremities: extremities normal, atraumatic, no cyanosis or edema Pulses: 2+ and symmetric Skin: Skin color, texture, turgor normal. No rashes or lesions Lymph nodes: Cervical, supraclavicular, and axillary nodes normal. Neurologic: Grossly normal    Assessment:    Healthy female exam.      Plan:   Problem List Items Addressed This Visit      Unprioritized   Obesity   Relevant Medications   phentermine 37.5 MG capsule    Other Visit Diagnoses    Encounter for routine gynecological examination with Papanicolaou smear of cervix    -  Primary    Relevant Medications    ibuprofen (ADVIL,MOTRIN) 200 MG tablet    levonorgestrel (MIRENA) 20 MCG/24HR IUD    Other Relevant Orders    CBC    Comprehensive metabolic panel    GC/Chlamydia Probe Amp    Hepatitis B surface antigen    Hepatitis C antibody    HIV antibody    RPR  Cytology - PAP    TSH    VITAMIN D 25 Hydroxy (Vit-D Deficiency, Fractures)    Lipid panel         See After Visit Summary for Counseling Recommendations

## 2015-12-02 LAB — RPR

## 2015-12-02 LAB — GC/CHLAMYDIA PROBE AMP
CT PROBE, AMP APTIMA: NOT DETECTED
GC Probe RNA: NOT DETECTED

## 2015-12-02 LAB — HEPATITIS B SURFACE ANTIGEN: Hepatitis B Surface Ag: NEGATIVE

## 2015-12-02 LAB — HEPATITIS C ANTIBODY: HCV AB: NEGATIVE

## 2015-12-04 LAB — HIV ANTIBODY (ROUTINE TESTING W REFLEX): HIV 1&2 Ab, 4th Generation: NONREACTIVE

## 2015-12-04 LAB — CYTOLOGY - PAP

## 2015-12-04 MED FILL — PHENTERMINE 37.5 MG TABLET: 37.5 | 30 days supply | Qty: 30 | Fill #0

## 2015-12-05 LAB — VITAMIN D 25 HYDROXY (VIT D DEFICIENCY, FRACTURES): Vit D, 25-Hydroxy: 34 ng/mL (ref 30–100)

## 2016-03-20 MED FILL — OFLOXACIN 0.3% EYE DROPS: 0.3 | 25 days supply | Qty: 5 | Fill #0

## 2016-06-11 ENCOUNTER — Ambulatory Visit: Payer: Self-pay | Admitting: Obstetrics and Gynecology

## 2016-06-17 DIAGNOSIS — I2699 Other pulmonary embolism without acute cor pulmonale: Secondary | ICD-10-CM

## 2016-06-17 HISTORY — DX: Other pulmonary embolism without acute cor pulmonale: I26.99

## 2016-06-18 ENCOUNTER — Ambulatory Visit (INDEPENDENT_AMBULATORY_CARE_PROVIDER_SITE_OTHER): Payer: 59 | Admitting: Family Medicine

## 2016-06-18 ENCOUNTER — Encounter: Payer: Self-pay | Admitting: Family Medicine

## 2016-06-18 VITALS — BP 138/85 | HR 96 | Ht 64.5 in | Wt 247.0 lb

## 2016-06-18 DIAGNOSIS — Z30433 Encounter for removal and reinsertion of intrauterine contraceptive device: Secondary | ICD-10-CM | POA: Diagnosis not present

## 2016-06-18 MED ORDER — LEVONORGESTREL 20 MCG/24HR IU IUD
INTRAUTERINE_SYSTEM | Freq: Once | INTRAUTERINE | Status: AC
  Administered 2016-06-18: 14:00:00 via INTRAUTERINE

## 2016-06-18 NOTE — Patient Instructions (Signed)
Intrauterine Device Information Introduction An intrauterine device (IUD) is a medical device that gets inserted into the uterus to prevent pregnancy. It is a small, T-shaped device that has one or two nylon strings hanging down from it. The strings hang out of the lower part of the uterus (cervix) to allow for future IUD removal. There are two types of IUDs available:  Copper IUD. This type of IUD has copper wire wrapped around it. A copper IUD may last up to 10 years.  Hormone IUD. This type of IUD is made of plastic and contains the hormone progestin (synthetic progesterone). A hormone IUD may last 3 to 5 years. IUDs are inserted through the vagina and placed into the uterus with a minor medical procedure. How does the IUD work? Copper in the copper IUD prevents pregnancy by making the uterus and fallopian tubes produce a fluid that kills sperm. Synthetic progesterone in hormonal IUD prevents pregnancy by:  Thickening cervical mucus to prevent sperm from entering the uterus.  Thinning the uterine lining to prevent implantation of a fertilized egg.  Weakening or killing sperm that get into the uterus. What are the advantages of an IUD?  IUDs are highly effective, reversible, long-acting, and low-maintenance.  There are no estrogen-related side effects.  An IUD can be used when breastfeeding.  IUDs are not associated with weight gain.  Advantages of the copper IUD are that:  It works immediately after insertion.  It does not interfere with your body's natural hormones.  It can be used for 10 years.  Advantages of the hormone IUD are that:  If it is inserted within 7 days of your period starting, it works immediately after insertion. If the hormone IUD is inserted at any other time in your cycle, you will need to use a backup method of birth control for 7 days after insertion.  It can make menstrual periods lighter.  It can decrease menstrual cramping.  It can be used for 3  or 5 years. What are the disadvantages of an IUD?  The hormone IUD may cause irregular menstrual bleeding for a period of time after insertion.  The copper IUD can make your menstrual flow heavier and more painful.  You may experience some pain during insertion, and cramping and vaginal bleeding after insertion. How is the IUD removed? Is the IUD right for me?  Your health care provider will make sure you are a good candidate for an IUD and will discuss side effects with you. This information is not intended to replace advice given to you by your health care provider. Make sure you discuss any questions you have with your health care provider. Document Released: 05/07/2004 Document Revised: 11/09/2015 Document Reviewed: 11/22/2012  2017 Elsevier  

## 2016-06-18 NOTE — Progress Notes (Signed)
   Subjective: Patient here for IUD change.  Has been in for 5 years and due to be changed. She has no cycles and desires to continue Mirena IUD for contraception.  Objective: Vitals:   06/18/16 1311  BP: 138/85  Pulse: 96    Patient identified, informed consent performed, signed copy in chart, time out was performed Procedure: Speculum placed inside vagina.  Cervix visualized.  Strings grasped with ring forceps.  IUD removed intact.  Procedure: Cervix visualized.  Cleaned with Betadine x 2.  Grasped anteriourly with a single tooth tenaculum.  Uterus sounded to 7 cm.  Mirena IUD placed per manufacturer's recommendations.  Strings trimmed to 3 cm.   Patient given post procedure instructions and Mirena care card with expiration date.  Patient is asked to check IUD strings periodically and follow up in 4-6 weeks for IUD check.  Impression: Encounter for IUD removal and reinsertion   Plan: Continue IUD for 5 years. Return in about 4 weeks (around 07/16/2016) for iud check.

## 2016-06-19 ENCOUNTER — Encounter: Payer: Self-pay | Admitting: *Deleted

## 2017-02-25 ENCOUNTER — Observation Stay (HOSPITAL_BASED_OUTPATIENT_CLINIC_OR_DEPARTMENT_OTHER)
Admission: EM | Admit: 2017-02-25 | Discharge: 2017-02-26 | Disposition: A | Discharge status: Home / Self Care | Payer: 59 | Attending: Family Medicine | Admitting: Family Medicine

## 2017-02-25 ENCOUNTER — Emergency Department (HOSPITAL_BASED_OUTPATIENT_CLINIC_OR_DEPARTMENT_OTHER): Payer: 59

## 2017-02-25 ENCOUNTER — Encounter (HOSPITAL_BASED_OUTPATIENT_CLINIC_OR_DEPARTMENT_OTHER): Payer: Self-pay | Admitting: Emergency Medicine

## 2017-02-25 DIAGNOSIS — R000 Tachycardia, unspecified: Secondary | ICD-10-CM | POA: Insufficient documentation

## 2017-02-25 DIAGNOSIS — K219 Gastro-esophageal reflux disease without esophagitis: Secondary | ICD-10-CM | POA: Insufficient documentation

## 2017-02-25 DIAGNOSIS — I2699 Other pulmonary embolism without acute cor pulmonale: Principal | ICD-10-CM | POA: Insufficient documentation

## 2017-02-25 DIAGNOSIS — E669 Obesity, unspecified: Secondary | ICD-10-CM | POA: Insufficient documentation

## 2017-02-25 DIAGNOSIS — Z6841 Body Mass Index (BMI) 40.0 and over, adult: Secondary | ICD-10-CM | POA: Diagnosis not present

## 2017-02-25 DIAGNOSIS — E559 Vitamin D deficiency, unspecified: Secondary | ICD-10-CM | POA: Diagnosis not present

## 2017-02-25 LAB — CBC WITH DIFFERENTIAL/PLATELET
BASOS ABS: 0 10*3/uL (ref 0.0–0.1)
BASOS PCT: 0 %
EOS ABS: 0 10*3/uL (ref 0.0–0.7)
EOS PCT: 0 %
HCT: 37.8 % (ref 36.0–46.0)
HEMOGLOBIN: 12.6 g/dL (ref 12.0–15.0)
Lymphocytes Relative: 23 %
Lymphs Abs: 2 10*3/uL (ref 0.7–4.0)
MCH: 26.9 pg (ref 26.0–34.0)
MCHC: 33.3 g/dL (ref 30.0–36.0)
MCV: 80.8 fL (ref 78.0–100.0)
MONOS PCT: 7 %
Monocytes Absolute: 0.6 10*3/uL (ref 0.1–1.0)
NEUTROS PCT: 70 %
Neutro Abs: 6 10*3/uL (ref 1.7–7.7)
PLATELETS: 262 10*3/uL (ref 150–400)
RBC: 4.68 MIL/uL (ref 3.87–5.11)
RDW: 13.4 % (ref 11.5–15.5)
WBC: 8.6 10*3/uL (ref 4.0–10.5)

## 2017-02-25 LAB — BASIC METABOLIC PANEL
ANION GAP: 9 (ref 5–15)
BUN: 10 mg/dL (ref 6–20)
CHLORIDE: 103 mmol/L (ref 101–111)
CO2: 23 mmol/L (ref 22–32)
Calcium: 9 mg/dL (ref 8.9–10.3)
Creatinine, Ser: 0.82 mg/dL (ref 0.44–1.00)
Glucose, Bld: 94 mg/dL (ref 65–99)
POTASSIUM: 3.9 mmol/L (ref 3.5–5.1)
SODIUM: 135 mmol/L (ref 135–145)

## 2017-02-25 LAB — TROPONIN I: Troponin I: <0.03 ng/mL (ref <0.03)

## 2017-02-25 LAB — PREGNANCY, URINE: PREG TEST UR: NEGATIVE

## 2017-02-25 MED ORDER — RIVAROXABAN 15 MG PO TABS
15.0000 mg | ORAL_TABLET | Freq: Once | ORAL | Status: AC
Start: 2017-02-25 — End: 2017-02-25
  Administered 2017-02-25: 15 mg via ORAL
  Filled 2017-02-25: qty 1

## 2017-02-25 MED ORDER — IOPAMIDOL (ISOVUE-370) INJECTION 76%: 100.0000 mL | Freq: Once | INTRAVENOUS | Status: DC | PRN

## 2017-02-25 NOTE — ED Triage Notes (Signed)
Patient states that she has had chest pain with increased SOB x 2 -3 days  - the patient reports that she has had an increase in coughing and coughing up blood. Patient reports that pain to her left chest up into her left shoulder and to her back. PAtient has raspy voice and increase SOB and decrease o2 after walking to the triage chair

## 2017-02-25 NOTE — ED Provider Notes (Signed)
[facility]   CSN: [id number] Arrival date & time: 02/25/17  1923     History   Chief Complaint Chief Complaint  Patient presents with  . Shortness of Breath    HPI Savannah Holland is a 31 y.o. female.  Patient presents with 2 day history of left-sided chest pain, increased shortness of breath with decreased exercise tolerance, hemoptysis. Pain is the left upper chest and shoulder, much worse with deep breathing. No measured fevers, URI symptoms. No abdominal pain, vomiting, or diarrhea. Patient states that she will get left arm numbness when she is stressed but does not typically have chest pain. Patient denies risk factors for pulmonary embolism including: unilateral leg swelling, history of DVT/PE/other blood clots, use of exogenous hormones, recent immobilizations, recent surgery, recent travel (>4hr segment) -- travel by [place] to [place] over a month ago, took breaks, malignancy. She does not have a family history of clots or Factor V Leiden mutation.        Past Medical History:  Diagnosis Date  . Allergy   . GERD (gastroesophageal reflux disease)     Patient Active Problem List   Diagnosis Date Noted  . Obesity 08/19/2014  . Vitamin D deficiency 08/19/2014    History reviewed. No pertinent surgical history.  OB History    Gravida Para Term Preterm AB Living   1 1 1  0 0 1   SAB TAB Ectopic Multiple Live Births   0 0 0 0 1       Home Medications    Prior to Admission medications   Medication Sig Start Date End Date Taking? Authorizing Provider  ibuprofen (ADVIL,MOTRIN) 200 MG tablet Take 200 mg by mouth every 6 (six) hours as needed.    [provider]  levonorgestrel (MIRENA) 20 MCG/24HR IUD 1 each by Intrauterine route once. 03/18/11 03/17/16  [provider]    Family History Family History  Problem Relation Age of Onset  . Fibroids Mother   . Hyperlipidemia Father   . GER disease Father   . Prostate cancer  Father   . Diabetes Maternal Grandmother   . Diabetes Paternal Grandmother     Social History Social History  Substance Use Topics  . Smoking status: Never Smoker  . Smokeless tobacco: Never Used  . Alcohol use Yes     Comment: rare     Allergies   Patient has no known allergies.   Review of Systems Review of Systems  Constitutional: Negative for fever.  HENT: Negative for rhinorrhea and sore throat.   Eyes: Negative for redness.  Respiratory: Positive for cough and shortness of breath.   Cardiovascular: Positive for chest pain. Negative for leg swelling.  Gastrointestinal: Negative for abdominal pain, diarrhea, nausea and vomiting.  Genitourinary: Negative for dysuria.  Musculoskeletal: Positive for back pain. Negative for myalgias.  Skin: Negative for rash.  Neurological: Positive for numbness. Negative for headaches.     Physical Exam Updated Vital Signs BP (!) 152/93 (BP Location: Left Arm)   Temp 100.1 F (37.8 C) (Oral)   Resp 16   Ht 5\' 5"  (1.651 m)   Wt 111.6 kg (246 lb)   LMP 02/15/2017   SpO2 98%   BMI 40.94 kg/m   Physical Exam  Constitutional: She appears well-developed and well-nourished.  HENT:  Head: Normocephalic and atraumatic.  Mouth/Throat: Oropharynx is clear and moist.  Eyes: Conjunctivae are normal. Right eye exhibits no discharge. Left eye exhibits no discharge.  Neck: Normal range  of motion. Neck supple.  Cardiovascular: Regular rhythm and normal heart sounds.  Tachycardia present.   Pulmonary/Chest: Effort normal and breath sounds normal.  Abdominal: Soft. There is no tenderness.  Neurological: She is alert.  Skin: Skin is warm and dry.  Psychiatric: She has a normal mood and affect.  Nursing note and vitals reviewed.    ED Treatments / Results  Labs (all labs ordered are listed, but only abnormal results are displayed) Labs Reviewed  TROPONIN I  CBC WITH DIFFERENTIAL/PLATELET  BASIC METABOLIC PANEL  PREGNANCY, URINE     EKG  EKG Interpretation  Date/Time:  Tuesday February 25 2017 19:40:43 EDT Ventricular Rate:  121 PR Interval:  134 QRS Duration: 74 QT Interval:  304 QTC Calculation: 431 R Axis:   58 Text Interpretation:  Sinus tachycardia Possible Anterior infarct , age undetermined Abnormal ECG [id number] Confirmed by Drema Pry [phone]) on 02/25/2017 10:02:42 PM       Radiology Dg Chest 2 View  Result Date: 02/25/2017 CLINICAL DATA:  Dyspnea, hemoptysis and left-sided pain EXAM: CHEST  2 VIEW COMPARISON:  None. FINDINGS: The heart size and mediastinal contours are within normal limits. Small focus of airspace opacity at the left lung base is suspicious for pneumonia. It appears to be lateralizing to the lingula. The visualized skeletal structures are unremarkable. IMPRESSION: Small focus of airspace opacity in the lingula suspicious for pneumonia. Electronically Signed   By: Tollie Eth M.D.   On: 02/25/2017 20:07    Procedures Procedures (including critical care time)  Medications Ordered in ED Medications  iopamidol (ISOVUE-370) 76 % injection 100 mL (not administered)  Rivaroxaban (XARELTO) tablet 15 mg (15 mg Oral Given 02/25/17 2251)     Initial Impression / Assessment and Plan / ED Course  I have reviewed the triage vital signs and the nursing notes.  Pertinent labs & imaging results that were available during my care of the patient were reviewed by me and considered in my medical decision making (see chart for details).     Patient seen and examined. No significant risk factors for PE, x-ray shows possible pneumonia in lingula. However given persistent tachycardia, acute change in exercise tolerance, no high fevers, pleuritic chest pain, PE remains on differential. She is moderate risk and will proceed with CT imaging after discussion with patient.  Vital signs reviewed and are as follows: BP (!) 152/93 (BP Location: Left Arm)   Temp 100.1 F (37.8 C) (Oral)   Resp 16   Ht   (1.651 m)   Wt 111.6 kg (246 lb)   LMP 02/15/2017   SpO2 98%   BMI 40.94 kg/m   CT reviewed by myself. Discussed with radiologist. She has PE + infarct.   Pt d/w and seen by Dr. Eudelia Bunch. Discussed admit vs d/c with close f/u with patient. After discussion with her husband, she would like to be admitted. Will ask [facility] to admit given her relationship with that group.   Final Clinical Impressions(s) / ED Diagnoses   Final diagnoses:  Other acute pulmonary embolism without acute cor pulmonale (HCC)   Admit, unprovoked PE.   New Prescriptions New Prescriptions   No medications on file     Renne Crigler, Cordelia Poche 02/25/17 2305    Nira Conn, MD 02/26/17 0010

## 2017-02-25 NOTE — ED Notes (Signed)
Walked patient around department with pulse ox, O2 stayed at 100% and HR at 145

## 2017-02-25 NOTE — ED Notes (Signed)
L side chest pain only present with inspiration. Not present at rest.

## 2017-02-26 ENCOUNTER — Observation Stay (HOSPITAL_BASED_OUTPATIENT_CLINIC_OR_DEPARTMENT_OTHER): Payer: 59

## 2017-02-26 DIAGNOSIS — E669 Obesity, unspecified: Secondary | ICD-10-CM

## 2017-02-26 DIAGNOSIS — I2699 Other pulmonary embolism without acute cor pulmonale: Secondary | ICD-10-CM

## 2017-02-26 DIAGNOSIS — Z6830 Body mass index (BMI) 30.0-30.9, adult: Secondary | ICD-10-CM

## 2017-02-26 LAB — HIV ANTIBODY (ROUTINE TESTING W REFLEX): HIV SCREEN 4TH GENERATION: NONREACTIVE

## 2017-02-26 MED ORDER — RIVAROXABAN 15 MG PO TABS: 15.0000 mg | ORAL_TABLET | Freq: Two times a day (BID) | ORAL | 0 refills | Status: DC

## 2017-02-26 MED ORDER — RIVAROXABAN 15 MG PO TABS
15.0000 mg | ORAL_TABLET | Freq: Two times a day (BID) | ORAL | Status: DC
  Administered 2017-02-26: 15 mg via ORAL
  Filled 2017-02-26: qty 1

## 2017-02-26 MED ORDER — RIVAROXABAN 15 MG PO TABS: 15.0000 mg | ORAL_TABLET | Freq: Two times a day (BID) | ORAL | Status: DC

## 2017-02-26 MED ORDER — ACETAMINOPHEN 325 MG PO TABS
650.0000 mg | ORAL_TABLET | Freq: Four times a day (QID) | ORAL | Status: DC | PRN
  Administered 2017-02-26: 650 mg via ORAL
  Filled 2017-02-26: qty 2

## 2017-02-26 MED ORDER — RIVAROXABAN 20 MG PO TABS: 20.0000 mg | ORAL_TABLET | Freq: Every day | ORAL | Status: DC

## 2017-02-26 MED ORDER — RIVAROXABAN 20 MG PO TABS: 20.0000 mg | ORAL_TABLET | Freq: Every day | ORAL | 0 refills | Status: DC

## 2017-02-26 MED FILL — XARELTO 15 MG TABLET: 15 | 20 days supply | Qty: 40 | Fill #0

## 2017-02-26 NOTE — ED Notes (Signed)
Updated pt to delay in bed status.  Pt given warm blanket and extra pillow, pt adjusted position, denies any needs at this time

## 2017-02-26 NOTE — Discharge Instructions (Addendum)
You were admitted for observation of respiratory status after CTA chest significant for acute pulmonary embolus.  Continue Xarelto 15mg  BID until 03/18/2017.  Then, take Xarelto 20mg  daily for 3 months.  Follow up with Dr. Alanda SlimGonfa on 03/04/17 at 1:30pm.  Information on my medicine - XARELTO (rivaroxaban)  WHY WAS XARELTO PRESCRIBED FOR YOU? Xarelto was prescribed to treat blood clots that may have been found in the veins of your legs (deep vein thrombosis) or in your lungs (pulmonary embolism) and to reduce the risk of them occurring again.  What do you need to know about Xarelto? The starting dose is one 15 mg tablet taken TWICE daily with food for the FIRST 21 DAYS then on (enter date)  03/19/17  the dose is changed to one 20 mg tablet taken ONCE A DAY with your evening meal.  DO NOT stop taking Xarelto without talking to the health care provider who prescribed the medication.  Refill your prescription for 20 mg tablets before you run out.  After discharge, you should have regular check-up appointments with your healthcare provider that is prescribing your Xarelto.  In the future your dose may need to be changed if your kidney function changes by a significant amount.  What do you do if you miss a dose? If you are taking Xarelto TWICE DAILY and you miss a dose, take it as soon as you remember. You may take two 15 mg tablets (total 30 mg) at the same time then resume your regularly scheduled 15 mg twice daily the next day.  If you are taking Xarelto ONCE DAILY and you miss a dose, take it as soon as you remember on the same day then continue your regularly scheduled once daily regimen the next day. Do not take two doses of Xarelto at the same time.   Important Safety Information Xarelto is a blood thinner medicine that can cause bleeding. You should call your healthcare provider right away if you experience any of the following: ? Bleeding from an injury or your nose that does not  stop. ? Unusual colored urine (red or dark brown) or unusual colored stools (red or black). ? Unusual bruising for unknown reasons. ? A serious fall or if you hit your head (even if there is no bleeding).  Some medicines may interact with Xarelto and might increase your risk of bleeding while on Xarelto. To help avoid this, consult your healthcare provider or pharmacist prior to using any new prescription or non-prescription medications, including herbals, vitamins, non-steroidal anti-inflammatory drugs (NSAIDs) and supplements.  This website has more information on Xarelto: [email or web address].xarelto.com.

## 2017-02-26 NOTE — Progress Notes (Signed)
Patient  discharged to home with instruction. 

## 2017-02-26 NOTE — Progress Notes (Signed)
*  PRELIMINARY RESULTS* Vascular Ultrasound Bilateral lower extremity venous duplex has been completed.  Preliminary findings: No evidence of deep vein thrombosis or baker's cysts bilaterally.   Chauncey FischerCharlotte C Kerstyn Coryell 02/26/2017, 9:10 AM

## 2017-02-26 NOTE — Progress Notes (Signed)
Family Medicine Teaching Service Daily Progress Note Intern Pager: [phone]  Patient name: Savannah Holland Medical record number: [id number] Date of birth: [birth year] Age: 31 y.o. Gender: female  Primary Care Provider: Terressa Koyanagi, DO Consultants: None Code Status: Full  Pt Overview and Major Events to Date:  9/12 - Admit for observation for acute PE  Assessment and Plan: 31 yo female with PMH significant for obesity presents with 2 day history of L sided chest pain, DOE, and hemoptysis.   Acute Pulmonary Embolus CXR with airspace opacity in the lingula and CTA with acute pulmonary embolus to the lingula with associated pulmonary infarct with trace left effusion. No heart strain visualized. Hypercoaguable workup in ED unrevealing thus far with BMP/CBC wnl, negative UPreg, trop neg. EKG sinus tachycardia, S1Q3T3. History suggests provoked PE due to use of hormonal IUD (Mirena) with recent use of OCP (lo loestrin) the past few months and travel to [place] 1 month ago. Patient denies any prior history of DVT/PE, recent surgery or other immobilization, personal or FH of clotting disorder or malignancy. Patient has been tachycardic to low 100s and 145 with exertion, but saturations in 98-100%. Patient started on Xarelto on admission. Patient continues to endorse some pleuritic chest pain with deep breathing but states DOE has resolved. No further reports of hemoptysis since yesterday.  - Xarelto  daily - LE doppler U/S - 2L [place] as needed for saturations <92% - intermittent pulse ox  FEN/GI: Regular diet PPx: Xarelto   Disposition: Home today pending symptoms, continue Xarelto outpt (Rivaroxaban 15 mg by mouth twice daily for 21 days followed by 20 mg once daily per UpToDate)  Subjective:  Patient continues to endorse some pleuritic chest pain with deep breathing but states DOE has resolved. Denies SOB at rest. No further reports of hemoptysis since yesterday. Wants to go back to work  tomorrow.   Objective: Temp:  [98.2 F (36.8 C)-100.2 F (37.9 C)] 98.2 F (36.8 C) (09/12 0543) Pulse Rate:  [90-120] 100 (09/12 0543) Resp:  [11-24] 20 (09/12 0543) BP: (99-152)/(74-95) 123/74 (09/12 0543) SpO2:  [95 %-100 %] 99 % (09/12 0543) Weight:  [246 lb (111.6 kg)] 246 lb (111.6 kg) (09/12 0543)  Physical Exam: General: obese, sitting comfortably in bed, in NAD Cardiovascular: RRR, no murmurs/rubs/gallops Respiratory: distant lung sounds but CTA bilaterally. No appreciable wheezes/rales/rhonchi Extremities: WWP, no rashes, redness, increased warmth, no LE edema, Homan's sign negative.   Laboratory:  Recent Labs Lab 02/25/17 2020  WBC 8.6  HGB 12.6  HCT 37.8  PLT 262    Recent Labs Lab 02/25/17 2020  NA 135  K 3.9  CL 103  CO2 23  BUN 10  CREATININE 0.82  CALCIUM 9.0  GLUCOSE 94    Imaging/Diagnostic Tests:  CTA Chest 9/11 FINDINGS: Cardiovascular: Acute pulmonary embolus to the lingula with RV/LV ratio 0.68. Heart is normal in size. No pericardial effusion. No aortic aneurysm or dissection. Mediastinum/Nodes: No enlarged mediastinal, hilar, or axillary lymph nodes. Thyroid gland, trachea, and esophagus demonstrate no significant findings. Lungs/Pleura: Small left effusion associated with a wedge-shaped consolidation in the lingula likely to reflect a pulmonary infarct. Upper Abdomen: No acute abnormality. Musculoskeletal: No chest wall abnormality. No acute or significant osseous findings. Review of the MIP images confirms the above findings. IMPRESSION: Acute pulmonary embolus to the lingula with associated pulmonary infarct with trace left effusion. No heart strain.  CXR 9/11 FINDINGS: The heart size and mediastinal contours are within normal limits. Small focus of airspace opacity  at the left lung base is suspicious for pneumonia. It appears to be lateralizing to the lingula. The visualized skeletal structures are  unremarkable. IMPRESSION: Small focus of airspace opacity in the lingula suspicious for pneumonia.  Ellwood DenseRumball, Alison, DO 02/26/2017, 6:53 AM PGY-1, [facility] FPTS Intern pager: [phone], text pages welcome

## 2017-02-26 NOTE — Progress Notes (Signed)
SATURATION QUALIFICATIONS: (This note is used to comply with regulatory documentation for home oxygen)  Patient Saturations on Room Air at Rest = 96%  Patient Saturations on Room Air while Ambulating = 98%  Patient Saturations on 0 Liters of oxygen while Ambulating = NA  Please briefly explain why patient needs home oxygen:

## 2017-02-26 NOTE — Care Management Note (Signed)
Case Management Note  Patient Details  Name: Feliz BeamJazmin Eades MRN: [id number] Date of Birth: 1985/12/15  Subjective/Objective:                    Action/Plan:  Patient started on Xarelto . 30 day free card and $10 a month card given and explained. Placed benefits check to see if prior authorization is needed.   Patient voiced understanding to all of the above. Expected Discharge Date:                  Expected Discharge Plan:  Home/Self Care  In-House Referral:     Discharge planning Services  CM Consult, Medication Assistance  Post Acute Care Choice:    Choice offered to:  Patient  DME Arranged:    DME Agency:     HH Arranged:    HH Agency:     Status of Service:  In process, will continue to follow  If discussed at Long Length of Stay Meetings, dates discussed:    Additional Comments:  Kingsley PlanWile, Hovanes Hymas Marie, RN 02/26/2017, 10:06 AM

## 2017-02-26 NOTE — Care Management (Addendum)
  Benefit check:  1. XARELTO 20 MG DAILY  COVER- YES  CO-PAY- $ 101.47  TIER- 3 DRUG  PRIOR APPROVAL- NO   2. XARELTO 15 MG BID  COVER- YES  CO-PAY- $ 125.00  TIER- 3 DRUG  PRIOR APPROVAL- NO    3. ELIQUIS 2.5 MG BID  COVER- YES  CO-PAY- $ 101.47  TIER- 3 DRUG  PRIOR APPROVAL- NO   4. ELIQUIS 5 MG BIS  COVER- YES  CO-PAY- $ 101.47  TIER- 3 DRUG  PRIOR APPROVAL- NO   DEDUCTIBLE- NO  PATIENT 25 % OF TOTAL COAST    PREFERRED PHARMACY :  OUTPATIENT   Pres[facility] . [facility] spoke with Chales AbrahamsMary Ann. Patient can use [facility] location. Co pay is  $108 before progressing 30 day free card or the  $10 co pay card. Patient can use [facility]. Explained to patient. Patient voiced understanding.

## 2017-02-26 NOTE — Progress Notes (Signed)
Patient admitted to room [phone]  [facility]. Alert and oriented x4. Vital signs stable, denies pain. Oriented to room and call bell. Md made aware of patient's arrival to floor.

## 2017-02-26 NOTE — Discharge Summary (Signed)
[facility] Discharge Summary  Patient name: Savannah Holland Medical record number: [id number] Date of birth: 02-25-1986 Age: 31 y.o. Gender: female Date of Admission: 02/25/2017  Date of Discharge: 02/26/2017 Admitting Physician: Tobey GrimJeffrey H Walden, MD  Primary Care Provider: Reva BoresPratt, Tanya S, MD Consultants: None  Indication for Hospitalization: Dyspnea, hemoptysis  Discharge Diagnoses/Problem List:  Acute Pulmonary Embolus, provoked  Disposition: Home  Discharge Condition: Stable  Discharge Exam:  General: obese, sitting comfortably in bed, in NAD Cardiovascular: RRR, no murmurs/rubs/gallops Respiratory: distant lung sounds but CTA bilaterally. No appreciable wheezes/rales/rhonchi Extremities: WWP, no rashes, redness, increased warmth, no LE edema, Homan's sign negative.   Brief Hospital Course:  31 yo female presented with 2 day history of dyspnea, worsened with exertion and L sided pleuritic chest pain, and 1 episode of hemoptysis.  Patient's hypercoagulable risk factors include recent [place] one month ago and recent hormonal contraceptive use (Lo loestrin in June - August with Mirena IUD). In the ED, was tachypneic to 22 and tachycardic to low 100s and up to 145 with ambulation. Saturations remained upper 90s-100 on room air. BMP, CBC, troponin, pregnancy test all within normal limits. EKG showed sinus tachycardia with S1Q3T3. CXR concerning for lingula pneumonia which was later confirmed to be acute PE by CTA chest with associated pulmonary infarct with trace left effusion without heart strain.  Patient was started on Xarelto 15mg  BID and observed for worsening in respiratory status.  Prior to discharge, patient ambulated without much difficulty with saturations remaining in the upper 90s.  Issues for Follow Up:  1. Ensure resolution of symptoms 2. Ensure toleration and compliance with Xarelto.  1. Xarelto 15mg  BID until 03/19/2017, Xarelto  20mg  daily thereafter for 3 months.  Significant Procedures: None  Significant Labs and Imaging:   Recent Labs Lab 02/25/17 2020  WBC 8.6  HGB 12.6  HCT 37.8  PLT 262    Recent Labs Lab 02/25/17 2020  NA 135  K 3.9  CL 103  CO2 23  GLUCOSE 94  BUN 10  CREATININE 0.82  CALCIUM 9.0   CTA Chest 9/11 FINDINGS: Cardiovascular: Acute pulmonary embolus to the lingula with RV/LV ratio 0.68. Heart is normal in size. No pericardial effusion. No aortic aneurysm or dissection. Mediastinum/Nodes: No enlarged mediastinal, hilar, or axillary lymph nodes. Thyroid gland, trachea, and esophagus demonstrate no significant findings. Lungs/Pleura: Small left effusion associated with a wedge-shaped consolidation in the lingula likely to reflect a pulmonary infarct. Upper Abdomen: No acute abnormality. Musculoskeletal: No chest wall abnormality. No acute or significant osseous findings. Review of the MIP images confirms the above findings. IMPRESSION: Acute pulmonary embolus to the lingula with associated pulmonary infarct with trace left effusion. No heart strain.  CXR 9/11 FINDINGS: The heart size and mediastinal contours are within normal limits. Small focus of airspace opacity at the left lung base is suspicious for pneumonia. It appears to be lateralizing to the lingula. The visualized skeletal structures are unremarkable. IMPRESSION: Small focus of airspace opacity in the lingula suspicious for pneumonia.  Results/Tests Pending at Time of Discharge:  LE Doppler 9/12  Discharge Medications:  Allergies as of 02/26/2017   No Known Allergies     Medication List    TAKE these medications   levonorgestrel 20 MCG/24HR IUD Commonly known as:  MIRENA 1 each by Intrauterine route once.   Rivaroxaban 15 MG Tabs tablet Commonly known as:  XARELTO Take 1 tablet (15 mg total) by mouth 2 (two) times daily with  a meal.   rivaroxaban 20 MG Tabs tablet Commonly known as:   XARELTO Take 1 tablet (20 mg total) by mouth daily with supper. Start taking on:  03/19/2017            Discharge Care Instructions        Start     Ordered   03/19/17 0000  rivaroxaban (XARELTO) 20 MG TABS tablet  Daily with supper     02/26/17 1154   02/26/17 0000  Rivaroxaban (XARELTO) 15 MG TABS tablet  2 times daily with meals     02/26/17 1154      Discharge Instructions: Please refer to Patient Instructions section of EMR for full details.  Patient was counseled important signs and symptoms that should prompt return to medical care, changes in medications, dietary instructions, activity restrictions, and follow up appointments.   Follow-Up Appointments: Follow-up Information    Almon Hercules, MD Follow up.   Specialty:  Family Medicine Why:  Follow up with Dr. Alanda Slim on 03/04/17 at 1:30pm. Contact information: [address] [place] [address] [phone]           Ellwood Dense, DO 02/26/2017, 11:55 AM PGY-1, [facility]

## 2017-02-26 NOTE — H&P (Signed)
Family Medicine Teaching [facility] Admission History and Physical Service Pager: [phone]  Patient name: Savannah Holland Medical record number: [id number] Date of birth: Aug 08, 1985 Age: 31 y.o. Gender: female  Primary Care Provider: Terressa Koyanagi, DO Consultants: none Code Status: full  Chief Complaint: Chest pain or shortness of breath  Assessment and Plan: Savannah Holland is a 31 y.o. female presenting with chest pain or shortness of breath . PMH is significant for obesity  Pulmonary embolism: Acute. I consider this provoked given recent car travel and use of OCP. Not sure about the source of a PE. Patient is hemodynamically stable. No signs of heart strain on CTA. Symptoms improved on arrival to the floor. Heart rate down to 100 now. Still satting in upper 90s on room air. Doubt infectious etiology without fever or leukocytosis. Troponin negative. No signs of ischemia on EKG. She has no significant cardiac history. -We will admit for observation. Attending Dr. Gwendolyn Grant -Continue Xarelto starter pack -DVT Doppler -When necessary oxygen -Intermittent pulse ox  FEN/GI: -Regular diet Prophylaxis:  -On Xarelto for PE  Disposition: Admit for observation. She may be safely discharged after DVT Doppler. This could also be done as an outpatient.   History of Present Illness:  Savannah Holland is a 31 y.o. female  presenting with chest pain, shortness of breath and hemoptysis Patient was in her usual state of health up until 2 days ago when she started feeling left-sided chest pain with deep breathing & dyspnea. Yesterday, she had a cough and noted hemoptysis (frank blood) which prompted her to go to [facility]. She denies fever, chills, nausea/vomiting/diarrhea, leg or arm swelling. Denies personal or family history of bleeding or clotting issue. She is on Mirena IUD for birth control. She admits recent use of OCP in the month of June, July and early August to regulate her  period. She also reports a travel by car to [place] about a month ago. However, she reports frequent breaks during this travel.   ED course: Blood pressure 152/93 on arrival to ED. She was tachypneic to 22 and tachycardic to low 100s. Pulse ox has been in upper 90s and 100 consistently. BMP, CBC, troponin and pregnancy test was normal limits. EKG classic for PE. CXR concerning for lingula pneumonia which was later confirmed to be acute PE by CTA chest with associated pulmonary infarct with trace left effusion without heart strain. Patient was started on Xarelto. She was ambulated in ED and was tachycardic to 140s. She was given a choice, admit or d/c, and she chose the first.   Review of Systems  Constitutional: Negative for fever and weight loss.  HENT: Negative for sore throat.   Eyes: Negative for blurred vision and photophobia.  Respiratory: Positive for cough, hemoptysis and shortness of breath.   Cardiovascular: Positive for chest pain. Negative for palpitations and leg swelling.  Gastrointestinal: Negative for abdominal pain, blood in stool, diarrhea, melena and vomiting.  Genitourinary: Negative for dysuria and hematuria.  Musculoskeletal: Negative for myalgias.  Skin: Negative for rash.  Neurological: Negative for speech change, focal weakness, weakness and headaches.  Endo/Heme/Allergies: Does not bruise/bleed easily.  Psychiatric/Behavioral: Negative for depression and substance abuse. The patient is not nervous/anxious.     Patient Active Problem List   Diagnosis Date Noted  . Pulmonary embolism (HCC) 02/25/2017  . Obesity 08/19/2014  . Vitamin D deficiency 08/19/2014    Past Medical History: Past Medical History:  Diagnosis Date  . Allergy   . GERD (gastroesophageal  reflux disease)     Past Surgical History: History reviewed. No pertinent surgical history.  Social History: Social History  Substance Use Topics  . Smoking status: Never Smoker  . Smokeless tobacco:  Never Used  . Alcohol use Yes     Comment: rare   Additional social history: none  Please also refer to relevant sections of EMR.  Family History: Family History  Problem Relation Age of Onset  . Fibroids Mother   . Hyperlipidemia Father   . GER disease Father   . Prostate cancer Father   . Diabetes Maternal Grandmother   . Diabetes Paternal Grandmother    (If not completed, MUST add something in)  Allergies and Medications: No Known Allergies No current facility-administered medications on file prior to encounter.    Current Outpatient Prescriptions on File Prior to Encounter  Medication Sig Dispense Refill  . levonorgestrel (MIRENA) 20 MCG/24HR IUD 1 each by Intrauterine route once.      Objective: BP 123/74 (BP Location: Left Arm)   Pulse 100   Temp 98.2 F (36.8 C) (Oral)   Resp 20   Ht  (1.651 m)   Wt 246 lb (111.6 kg)   LMP 02/15/2017   SpO2 99%   BMI 40.94 kg/m  Exam: GEN: appears well, no apparent distress. Head: normocephalic and atraumatic  Eyes: conjunctiva without injection, sclera anicteric Ears: external ear and ear canal normal Nares: no rhinorrhea, congestion or erythema Oropharynx: mmm without erythema or exudation HEM: negative for cervical or periauricular lymphadenopathies CVS: Tachycardic, RR, nl s1 & s2, no murmurs, no edema. RESP: no IWOB, good air movement bilaterally, CTAB GI: BS present & normal, soft, NTND, no guarding, no rebound, no mass GU: no suprapubic or CVA tenderness MSK: both legs appear symmetric, no calf tenderness or swelling. No swelling or tenderness in her arms SKIN: no apparent skin lesion or bruise ENDO: negative thyromegally NEURO: alert and oiented appropriately, no gross deficits  PSYCH: euthymic mood with congruent affect   Labs and Imaging: CBC BMET   Recent Labs Lab 02/25/17 2020  WBC 8.6  HGB 12.6  HCT 37.8  PLT 262    Recent Labs Lab 02/25/17 2020  NA 135  K 3.9  CL 103  CO2 23  BUN  10  CREATININE 0.82  GLUCOSE 94  CALCIUM 9.0     Dg Chest 2 View  Result Date: 02/25/2017 CLINICAL DATA:  Dyspnea, hemoptysis and left-sided pain EXAM: CHEST  2 VIEW COMPARISON:  None. FINDINGS: The heart size and mediastinal contours are within normal limits. Small focus of airspace opacity at the left lung base is suspicious for pneumonia. It appears to be lateralizing to the lingula. The visualized skeletal structures are unremarkable. IMPRESSION: Small focus of airspace opacity in the lingula suspicious for pneumonia. Electronically Signed   By: Tollie Eth M.D.   On: 02/25/2017 20:07   Ct Angio Chest Pe W And/or Wo Contrast  Result Date: 02/25/2017 CLINICAL DATA:  Left-sided chest pain and increased dyspnea with hemoptysis x3 days. EXAM: CT ANGIOGRAPHY CHEST WITH CONTRAST TECHNIQUE: Multidetector CT imaging of the chest was performed using the standard protocol during bolus administration of intravenous contrast. Multiplanar CT image reconstructions and MIPs were obtained to evaluate the vascular anatomy. CONTRAST:  100 cc Omnipaque 370 IV COMPARISON:  None. FINDINGS: Cardiovascular: Acute pulmonary embolus to the lingula with RV/LV ratio 0.68. Heart is normal in size. No pericardial effusion. No aortic aneurysm or dissection. Mediastinum/Nodes: No enlarged mediastinal, hilar, or  axillary lymph nodes. Thyroid gland, trachea, and esophagus demonstrate no significant findings. Lungs/Pleura: Small left effusion associated with a wedge-shaped consolidation in the lingula likely to reflect a pulmonary infarct. Upper Abdomen: No acute abnormality. Musculoskeletal: No chest wall abnormality. No acute or significant osseous findings. Review of the MIP images confirms the above findings. IMPRESSION: Acute pulmonary embolus to the lingula with associated pulmonary infarct with trace left effusion. No heart strain. Critical Value/emergent results were called by telephone at the time of interpretation on  02/25/2017 at 10:00 pm to [facility] , who verbally acknowledged these results. Electronically Signed   By: Tollie Ethavid  Kwon M.D.   On: 02/25/2017 22:02    Almon HerculesGonfa, Mylinda Brook T, MD 02/26/2017, 6:27 AM PGY-3, [facility] FPTS Intern pager: [phone], text pages welcome

## 2017-03-04 ENCOUNTER — Ambulatory Visit (INDEPENDENT_AMBULATORY_CARE_PROVIDER_SITE_OTHER): Payer: 59 | Admitting: Student

## 2017-03-04 ENCOUNTER — Encounter: Payer: Self-pay | Admitting: Student

## 2017-03-04 VITALS — BP 112/79 | HR 115 | Temp 98.7°F | Ht 65.0 in | Wt 245.6 lb

## 2017-03-04 DIAGNOSIS — I2699 Other pulmonary embolism without acute cor pulmonale: Secondary | ICD-10-CM

## 2017-03-04 NOTE — Patient Instructions (Addendum)
It was great seeing you today! We have addressed the following issues today  1. Pulmonary embolism: continue taking it Xarelto as we discussed. Avoid estrogen containing contraceptives and NSAIDs in the future. Follow up as needed.

## 2017-03-04 NOTE — Progress Notes (Signed)
  Subjective:    Savannah Holland is a 31 y.o. old female here for follow up on PE  HPI Pulmonary embolism: patient was hospitalized for PE about a week ago. PE was thought to be provoked due to her recent use of OCP and long car ride. Lower extremity DVT Doppler was negative. She was started on Zyprexa starter pack. She reports good compliance with his medication.  She denies side effect except for slightly heavier period. She reports brief shortness of breath about 5 days ago after she was discharged. She denies chest pain. She reports doing further research about a family history. She says her aunt had recurrent DVT and is on Xarelto.  PMH/Problem List: has Obesity; Vitamin D deficiency; and Pulmonary embolism (HCC) on her problem list.   has a past medical history of Allergy and GERD (gastroesophageal reflux disease).  FH:  Family History  Problem Relation Age of Onset  . Fibroids Mother   . Hyperlipidemia Father   . GER disease Father   . Prostate cancer Father   . Diabetes Maternal Grandmother   . Diabetes Paternal Grandmother     SH Social History  Substance Use Topics  . Smoking status: Never Smoker  . Smokeless tobacco: Never Used  . Alcohol use Yes     Comment: rare    Review of Systems Review of systems negative except for pertinent positives and negatives in history of present illness above.     Objective:     Vitals:   03/04/17 1333  BP: 112/79  Pulse: (!) 115  Temp: 98.7 F (37.1 C)  TempSrc: Oral  SpO2: 100%  Weight: 245 lb 9.6 oz (111.4 kg)  Height:  (1.651 m)   Body mass index is 40.87 kg/m.  Physical Exam GEN: appears well, no apparent distress. ENDO: negative thyromegally CVS: RRR, nl S1&S2, no murmurs, no edema RESP: no IWOB, good air movement bilaterally, CTAB GI: BS present & normal, soft, NTND MSK: no focal tenderness or notable swelling SKIN: no apparent skin lesion or skin bruising NEURO: alert and oiented appropriately, no gross  deficits  PSYCH: euthymic mood with congruent affect    Assessment and Plan:  1. Other acute pulmonary embolism without acute cor pulmonale (HCC): considered provoked due to recent OCP use and long car ride about a month prior to her PE. Lower extremity DVT Doppler was negative. She was started on Xarelto. Reports good compliance with her medications. Denies any side effect except for slightly heavy period. -Continue starter pack for a total of 3 weeks and then she will go to 20 mg daily for a total of 3 months. -Advised to avoid OCP and NSAIDs -No further workup needed at this point. -Follow up as needed   2. Tachycardia: This an episode since she had a PE. Last TSH about a year ago within normal limits. No anemia. No cardiopulmonary symptoms. She says she checks his heart rate at home and it's usually in 80s.  No Follow-up on file.  Almon Hercules, MD 03/04/17 Pager: [phone]

## 2017-03-18 MED FILL — XARELTO 20 MG TABLET: 20 | 90 days supply | Qty: 90 | Fill #0

## 2017-05-06 ENCOUNTER — Other Ambulatory Visit: Payer: Self-pay

## 2017-05-06 ENCOUNTER — Encounter: Payer: Self-pay | Admitting: Family Medicine

## 2017-05-06 ENCOUNTER — Ambulatory Visit (INDEPENDENT_AMBULATORY_CARE_PROVIDER_SITE_OTHER): Payer: 59 | Admitting: Family Medicine

## 2017-05-06 VITALS — BP 108/72 | HR 108 | Temp 98.8°F | Ht 65.0 in | Wt 250.0 lb

## 2017-05-06 DIAGNOSIS — Z01419 Encounter for gynecological examination (general) (routine) without abnormal findings: Secondary | ICD-10-CM

## 2017-05-06 DIAGNOSIS — I2699 Other pulmonary embolism without acute cor pulmonale: Secondary | ICD-10-CM

## 2017-05-06 DIAGNOSIS — F411 Generalized anxiety disorder: Secondary | ICD-10-CM | POA: Diagnosis not present

## 2017-05-06 MED ORDER — FLUOXETINE HCL 20 MG PO TABS: 20.0000 mg | ORAL_TABLET | Freq: Every day | ORAL | 3 refills | Status: DC

## 2017-05-06 MED FILL — FLUoxetine HCL 20 MG TABS: 20 | 30 days supply | Qty: 30 | Fill #0

## 2017-05-06 NOTE — Patient Instructions (Signed)
Preventive Care 18-39 Years, Female Preventive care refers to lifestyle choices and visits with your health care provider that can promote health and wellness. What does preventive care include?  A yearly physical exam. This is also called an annual well check.  Dental exams once or twice a year.  Routine eye exams. Ask your health care provider how often you should have your eyes checked.  Personal lifestyle choices, including: ? Daily care of your teeth and gums. ? Regular physical activity. ? Eating a healthy diet. ? Avoiding tobacco and drug use. ? Limiting alcohol use. ? Practicing safe sex. ? Taking vitamin and mineral supplements as recommended by your health care provider. What happens during an annual well check? The services and screenings done by your health care provider during your annual well check will depend on your age, overall health, lifestyle risk factors, and family history of disease. Counseling Your health care provider may ask you questions about your:  Alcohol use.  Tobacco use.  Drug use.  Emotional well-being.  Home and relationship well-being.  Sexual activity.  Eating habits.  Work and work Statistician.  Method of birth control.  Menstrual cycle.  Pregnancy history.  Screening You may have the following tests or measurements:  Height, weight, and BMI.  Diabetes screening. This is done by checking your blood sugar (glucose) after you have not eaten for a while (fasting).  Blood pressure.  Lipid and cholesterol levels. These may be checked every 5 years starting at age 66.  Skin check.  Hepatitis C blood test.  Hepatitis B blood test.  Sexually transmitted disease (STD) testing.  BRCA-related cancer screening. This may be done if you have a family history of breast, ovarian, tubal, or peritoneal cancers.  Pelvic exam and Pap test. This may be done every 3 years starting at age 40. Starting at age 59, this may be done every 5  years if you have a Pap test in combination with an HPV test.  Discuss your test results, treatment options, and if necessary, the need for more tests with your health care provider. Vaccines Your health care provider may recommend certain vaccines, such as:  Influenza vaccine. This is recommended every year.  Tetanus, diphtheria, and acellular pertussis (Tdap, Td) vaccine. You may need a Td booster every 10 years.  Varicella vaccine. You may need this if you have not been vaccinated.  HPV vaccine. If you are 69 or younger, you may need three doses over 6 months.  Measles, mumps, and rubella (MMR) vaccine. You may need at least one dose of MMR. You may also need a second dose.  Pneumococcal 13-valent conjugate (PCV13) vaccine. You may need this if you have certain conditions and were not previously vaccinated.  Pneumococcal polysaccharide (PPSV23) vaccine. You may need one or two doses if you smoke cigarettes or if you have certain conditions.  Meningococcal vaccine. One dose is recommended if you are age 27-21 years and a first-year college student living in a residence hall, or if you have one of several medical conditions. You may also need additional booster doses.  Hepatitis A vaccine. You may need this if you have certain conditions or if you travel or work in places where you may be exposed to hepatitis A.  Hepatitis B vaccine. You may need this if you have certain conditions or if you travel or work in places where you may be exposed to hepatitis B.  Haemophilus influenzae type b (Hib) vaccine. You may need this if  you have certain risk factors.  Talk to your health care provider about which screenings and vaccines you need and how often you need them. This information is not intended to replace advice given to you by your health care provider. Make sure you discuss any questions you have with your health care provider. Document Released: 07/30/2001 Document Revised: 02/21/2016  Document Reviewed: 04/04/2015 Elsevier Interactive Patient Education  2017 [employer].

## 2017-05-06 NOTE — Progress Notes (Signed)
Subjective:     Savannah BeamJazmin Gavigan is a 31 y.o. female and is here for a comprehensive physical exam. The patient reports problems - depression and anxiety. Having some marital issues. Feels anxious and worried. Is in school and working. Has seen therapist once. having trouble staying asleep. Has trouble turning off her thoughts. She is tearful.  Social History   Socioeconomic History  . Marital status: Married    Spouse name: Not on file  . Number of children: Not on file  . Years of education: Not on file  . Highest education level: Not on file  Social Needs  . Financial resource strain: Not on file  . Food insecurity - worry: Not on file  . Food insecurity - inability: Not on file  . Transportation needs - medical: Not on file  . Transportation needs - non-medical: Not on file  Occupational History  . Not on file  Tobacco Use  . Smoking status: Never Smoker  . Smokeless tobacco: Never Used  Substance and Sexual Activity  . Alcohol use: Yes    Comment: rare  . Drug use: No  . Sexual activity: Yes    Birth control/protection: IUD  Other Topics Concern  . Not on file  Social History Narrative   Work or School: MA at [employer]      Home Situation: lives with husband and daughter (3 yo)      Spiritual Beliefs: [employer]      Lifestyle: gym 2 days per week; diet is good sometimes, bad sometimes      Health Maintenance  Topic Date Due  . PAP SMEAR  11/29/2018  . TETANUS/TDAP  06/17/2020  . INFLUENZA VACCINE  Completed  . HIV Screening  Completed    The following portions of the patient's history were reviewed and updated as appropriate: allergies, current medications, past family history, past medical history, past social history, past surgical history and problem list.  Review of Systems Pertinent items noted in HPI and remainder of comprehensive ROS otherwise negative.   Objective:    BP 108/72   Pulse (!) 108   Temp 98.8 F (37.1 C) (Oral)    Ht 5\' 5"  (1.651 m)   Wt 250 lb (113.4 kg)   LMP 04/25/2017   SpO2 98%   BMI 41.60 kg/m  General appearance: alert, cooperative and appears stated age Head: Normocephalic, without obvious abnormality, atraumatic Neck: no adenopathy, supple, symmetrical, trachea midline and thyroid not enlarged, symmetric, no tenderness/mass/nodules Lungs: clear to auscultation bilaterally Breasts: normal appearance, no masses or tenderness Heart: regular rate and rhythm, S1, S2 normal, no murmur, click, rub or gallop Abdomen: soft, non-tender; bowel sounds normal; no masses,  no organomegaly Extremities: extremities normal, atraumatic, no cyanosis or edema Pulses: 2+ and symmetric Skin: Skin color, texture, turgor normal. No rashes or lesions Lymph nodes: Cervical, supraclavicular, and axillary nodes normal. Neurologic: Grossly normal    Assessment:    Healthy female exam.      Plan:      Problem List Items Addressed This Visit      Unprioritized   Pulmonary embolism (HCC) - Primary    Has 1 month left on Xarelto and then will stop this.      Generalized anxiety disorder    Begin with low dose Prozac. Use 1/2 tab x 3-5 nights. Increase to full. Usual side effects reviewed. Get energy back first, prior to mood or anxiety getting better. May need higher dose and would need  to increase this after 4-6 wks. Seek counseling.      Relevant Medications   FLUoxetine (PROZAC) 20 MG tablet    Other Visit Diagnoses    Encounter for gynecological examination without abnormal finding         Normal labs one year ago--repeat in 2 years. Pap not due until 2020. Continue Mirena S/p flu shot  Return in 3 months (on 08/06/2017).  See After Visit Summary for Counseling Recommendations

## 2017-05-06 NOTE — Assessment & Plan Note (Addendum)
Begin with low dose Prozac. Use 1/2 tab x 3-5 nights. Increase to full. Usual side effects reviewed. Get energy back first, prior to mood or anxiety getting better. May need higher dose and would need to increase this after 4-6 wks. Seek counseling.

## 2017-05-06 NOTE — Assessment & Plan Note (Signed)
Has 1 month left on Xarelto and then will stop this.

## 2017-05-28 ENCOUNTER — Other Ambulatory Visit: Payer: Self-pay | Admitting: Family Medicine

## 2017-05-28 DIAGNOSIS — F411 Generalized anxiety disorder: Secondary | ICD-10-CM

## 2017-05-28 MED ORDER — FLUOXETINE HCL 20 MG PO TABS: 20.0000 mg | ORAL_TABLET | Freq: Every day | ORAL | 3 refills | Status: DC

## 2017-06-05 MED FILL — FLUoxetine HCL 20 MG TABS: 20 | 30 days supply | Qty: 30 | Fill #1

## 2017-07-17 MED FILL — FLUoxetine HCL 20 MG TABS: 20 | 30 days supply | Qty: 30 | Fill #2

## 2017-08-26 MED FILL — FLUoxetine HCL 20 MG TABS: 20 | 30 days supply | Qty: 30 | Fill #0

## 2017-09-11 ENCOUNTER — Other Ambulatory Visit: Payer: Self-pay | Admitting: Family Medicine

## 2017-09-11 DIAGNOSIS — F411 Generalized anxiety disorder: Secondary | ICD-10-CM

## 2017-09-11 MED ORDER — FLUOXETINE HCL 20 MG PO TABS: 40.0000 mg | ORAL_TABLET | Freq: Every day | ORAL | 3 refills | Status: DC

## 2017-09-17 MED FILL — FLUoxetine HCL 40 MG CAPS: 40 | 90 days supply | Qty: 90 | Fill #0

## 2018-01-29 MED FILL — FLUoxetine HCL 40 MG CAPS: 40 | 90 days supply | Qty: 90 | Fill #1

## 2018-05-28 MED FILL — FLUoxetine HCL 40 MG CAPS: 40 | 90 days supply | Qty: 90 | Fill #2

## 2018-07-01 ENCOUNTER — Ambulatory Visit (INDEPENDENT_AMBULATORY_CARE_PROVIDER_SITE_OTHER): Payer: Self-pay | Admitting: Family Medicine

## 2018-07-01 VITALS — BP 122/80 | HR 108 | Temp 98.2°F | Wt 245.0 lb

## 2018-07-01 DIAGNOSIS — L918 Other hypertrophic disorders of the skin: Secondary | ICD-10-CM | POA: Insufficient documentation

## 2018-07-01 NOTE — Assessment & Plan Note (Addendum)
Skin tag on R lateral neck snipped off using Betadine for cleansing and sterile iris scissors. Local anesthesia lidocaine with epi was used. This pathognomonic lesions are not sent for pathology.

## 2018-07-01 NOTE — Progress Notes (Signed)
    Subjective:  Savannah Holland is a 33 y.o. female who presents to the [facility] today with a chief complaint of skin tag.   HPI:  She states that she has had skin tags along her neck for awhile. There is one on her R neck that has been rubbing on clothing and getting painful and irritation. No bleeding. No rashes  ROS: Per HPI   Objective:  Physical Exam: BP 122/80   Pulse (!) 108   Temp 98.2 F (36.8 C)   Wt 245 lb (111.1 kg)   SpO2 98%   BMI 40.77 kg/m   Gen: NAD, resting comfortably Pulm: NWOB Skin: warm, dry. R lateral neck with few scattered skin tags, largest 1 mm with slight erythema with edema or drainage Neuro: grossly normal, moves all extremities Psych: Normal affect and thought content   Assessment/Plan:  Inflamed skin tag Skin tag on R lateral neck snipped off using Betadine for cleansing and sterile iris scissors. Local anesthesia lidocaine with epi was used. This pathognomonic lesions are not sent for pathology.     Leland Her, DO PGY-3, [facility] 07/01/2018 11:07 AM

## 2018-07-06 NOTE — Progress Notes (Signed)
PAP 11/2015- NORMAL  Having intermittent lower back pain since late November

## 2018-07-07 ENCOUNTER — Encounter: Payer: Self-pay | Admitting: Family Medicine

## 2018-07-07 ENCOUNTER — Ambulatory Visit (INDEPENDENT_AMBULATORY_CARE_PROVIDER_SITE_OTHER): Payer: No Typology Code available for payment source | Admitting: Family Medicine

## 2018-07-07 VITALS — BP 134/83 | HR 84 | Ht 64.0 in | Wt 248.0 lb

## 2018-07-07 DIAGNOSIS — Z113 Encounter for screening for infections with a predominantly sexual mode of transmission: Secondary | ICD-10-CM

## 2018-07-07 DIAGNOSIS — Z1151 Encounter for screening for human papillomavirus (HPV): Secondary | ICD-10-CM

## 2018-07-07 DIAGNOSIS — Z124 Encounter for screening for malignant neoplasm of cervix: Secondary | ICD-10-CM | POA: Diagnosis not present

## 2018-07-07 DIAGNOSIS — Z01419 Encounter for gynecological examination (general) (routine) without abnormal findings: Secondary | ICD-10-CM

## 2018-07-07 NOTE — Progress Notes (Signed)
   Subjective:     Savannah Holland is a 33 y.o. female and is here for a comprehensive physical exam. The patient reports problems - low back pain after sitting in the car.  The following portions of the patient's history were reviewed and updated as appropriate: allergies, current medications, past family history, past medical history, past social history, past surgical history and problem list.  Review of Systems Pertinent items noted in HPI and remainder of comprehensive ROS otherwise negative.   Objective:    BP 134/83   Pulse 84   Ht 5\' 4"  (1.626 m)   Wt 248 lb (112.5 kg)   LMP 06/16/2018 (Exact Date)   BMI 42.57 kg/m  General appearance: alert, cooperative, appears stated age and moderately obese Head: Normocephalic, without obvious abnormality, atraumatic Back: symmetric, no curvature. ROM normal. No CVA tenderness. Lungs: clear to auscultation bilaterally Breasts: normal appearance, no masses or tenderness Heart: regular rate and rhythm, S1, S2 normal, no murmur, click, rub or gallop Abdomen: soft, non-tender; bowel sounds normal; no masses,  no organomegaly Pelvic: cervix normal in appearance, external genitalia normal, no adnexal masses or tenderness, no cervical motion tenderness, uterus normal size, shape, and consistency and vagina normal without discharge Extremities: extremities normal, atraumatic, no cyanosis or edema Pulses: 2+ and symmetric Skin: Skin color, texture, turgor normal. No rashes or lesions Lymph nodes: Cervical, supraclavicular, and axillary nodes normal. Neurologic: Grossly normal    Assessment:    Healthy female exam.      Plan:   Problem List Items Addressed This Visit    None    Visit Diagnoses    Screen for STD (sexually transmitted disease)    -  Primary   Relevant Orders   HIV Antibody (routine testing w rflx)   RPR   Hepatitis B surface antigen   Hepatitis C antibody   Screening for malignant neoplasm of cervix       Relevant  Orders   Cytology - PAP( [facility])   Encounter for gynecological examination without abnormal finding       Relevant Orders   CBC   TSH   Comprehensive metabolic panel   Lipid panel   Hemoglobin A1c   VITAMIN D 25 Hydroxy (Vit-D Deficiency, Fractures)     Return in 1 year (on 07/08/2019).    See After Visit Summary for Counseling Recommendations

## 2018-07-07 NOTE — Patient Instructions (Signed)
Preventive Care 18-39 Years, Female Preventive care refers to lifestyle choices and visits with your health care provider that can promote health and wellness. What does preventive care include?   A yearly physical exam. This is also called an annual well check.  Dental exams once or twice a year.  Routine eye exams. Ask your health care provider how often you should have your eyes checked.  Personal lifestyle choices, including: ? Daily care of your teeth and gums. ? Regular physical activity. ? Eating a healthy diet. ? Avoiding tobacco and drug use. ? Limiting alcohol use. ? Practicing safe sex. ? Taking vitamin and mineral supplements as recommended by your health care provider. What happens during an annual well check? The services and screenings done by your health care provider during your annual well check will depend on your age, overall health, lifestyle risk factors, and family history of disease. Counseling Your health care provider may ask you questions about your:  Alcohol use.  Tobacco use.  Drug use.  Emotional well-being.  Home and relationship well-being.  Sexual activity.  Eating habits.  Work and work environment.  Method of birth control.  Menstrual cycle.  Pregnancy history. Screening You may have the following tests or measurements:  Height, weight, and BMI.  Diabetes screening. This is done by checking your blood sugar (glucose) after you have not eaten for a while (fasting).  Blood pressure.  Lipid and cholesterol levels. These may be checked every 5 years starting at age 20.  Skin check.  Hepatitis C blood test.  Hepatitis B blood test.  Sexually transmitted disease (STD) testing.  BRCA-related cancer screening. This may be done if you have a family history of breast, ovarian, tubal, or peritoneal cancers.  Pelvic exam and Pap test. This may be done every 3 years starting at age 21. Starting at age 30, this may be done every 5  years if you have a Pap test in combination with an HPV test. Discuss your test results, treatment options, and if necessary, the need for more tests with your health care provider. Vaccines Your health care provider may recommend certain vaccines, such as:  Influenza vaccine. This is recommended every year.  Tetanus, diphtheria, and acellular pertussis (Tdap, Td) vaccine. You may need a Td booster every 10 years.  Varicella vaccine. You may need this if you have not been vaccinated.  HPV vaccine. If you are 26 or younger, you may need three doses over 6 months.  Measles, mumps, and rubella (MMR) vaccine. You may need at least one dose of MMR. You may also need a second dose.  Pneumococcal 13-valent conjugate (PCV13) vaccine. You may need this if you have certain conditions and were not previously vaccinated.  Pneumococcal polysaccharide (PPSV23) vaccine. You may need one or two doses if you smoke cigarettes or if you have certain conditions.  Meningococcal vaccine. One dose is recommended if you are age 19-21 years and a first-year college student living in a residence hall, or if you have one of several medical conditions. You may also need additional booster doses.  Hepatitis A vaccine. You may need this if you have certain conditions or if you travel or work in places where you may be exposed to hepatitis A.  Hepatitis B vaccine. You may need this if you have certain conditions or if you travel or work in places where you may be exposed to hepatitis B.  Haemophilus influenzae type b (Hib) vaccine. You may need this if you   have certain risk factors. Talk to your health care provider about which screenings and vaccines you need and how often you need them. This information is not intended to replace advice given to you by your health care provider. Make sure you discuss any questions you have with your health care provider. Document Released: 07/30/2001 Document Revised: 01/14/2017  Document Reviewed: 04/04/2015 Elsevier Interactive Patient Education  2019 [employer].

## 2018-07-08 LAB — CBC
Hematocrit: 38.9 % (ref 34.0–46.6)
Hemoglobin: 12.6 g/dL (ref 11.1–15.9)
MCH: 25.7 pg — AB (ref 26.6–33.0)
MCHC: 32.4 g/dL (ref 31.5–35.7)
MCV: 79 fL (ref 79–97)
Platelets: 323 10*3/uL (ref 150–450)
RBC: 4.9 x10E6/uL (ref 3.77–5.28)
RDW: 14.6 % (ref 11.7–15.4)
WBC: 5.2 10*3/uL (ref 3.4–10.8)

## 2018-07-08 LAB — COMPREHENSIVE METABOLIC PANEL
A/G RATIO: 1.4 (ref 1.2–2.2)
ALBUMIN: 4 g/dL (ref 3.8–4.8)
ALT: 19 IU/L (ref 0–32)
AST: 15 IU/L (ref 0–40)
Alkaline Phosphatase: 109 IU/L (ref 39–117)
BILIRUBIN TOTAL: 0.6 mg/dL (ref 0.0–1.2)
BUN / CREAT RATIO: 13 (ref 9–23)
BUN: 10 mg/dL (ref 6–20)
CO2: 21 mmol/L (ref 20–29)
Calcium: 9.4 mg/dL (ref 8.7–10.2)
Chloride: 103 mmol/L (ref 96–106)
Creatinine, Ser: 0.8 mg/dL (ref 0.57–1.00)
GFR calc non Af Amer: 97 mL/min/{1.73_m2} (ref >59)
GFR, EST AFRICAN AMERICAN: 112 mL/min/{1.73_m2} (ref >59)
GLOBULIN, TOTAL: 2.9 g/dL (ref 1.5–4.5)
Glucose: 87 mg/dL (ref 65–99)
POTASSIUM: 4.4 mmol/L (ref 3.5–5.2)
SODIUM: 137 mmol/L (ref 134–144)
TOTAL PROTEIN: 6.9 g/dL (ref 6.0–8.5)

## 2018-07-08 LAB — HEPATITIS B SURFACE ANTIGEN: Hepatitis B Surface Ag: NEGATIVE

## 2018-07-08 LAB — HEMOGLOBIN A1C
Est. average glucose Bld gHb Est-mCnc: 105 mg/dL
Hgb A1c MFr Bld: 5.3 % (ref 4.8–5.6)

## 2018-07-08 LAB — LIPID PANEL
CHOL/HDL RATIO: 3.3 ratio (ref 0.0–4.4)
Cholesterol, Total: 204 mg/dL — ABNORMAL HIGH (ref 100–199)
HDL: 61 mg/dL (ref >39)
LDL CALC: 118 mg/dL — AB (ref 0–99)
Triglycerides: 123 mg/dL (ref 0–149)
VLDL Cholesterol Cal: 25 mg/dL (ref 5–40)

## 2018-07-08 LAB — TSH: TSH: 1.37 u[IU]/mL (ref 0.450–4.500)

## 2018-07-08 LAB — HIV ANTIBODY (ROUTINE TESTING W REFLEX): HIV Screen 4th Generation wRfx: NONREACTIVE

## 2018-07-08 LAB — VITAMIN D 25 HYDROXY (VIT D DEFICIENCY, FRACTURES): Vit D, 25-Hydroxy: 10.9 ng/mL — ABNORMAL LOW (ref 30.0–100.0)

## 2018-07-08 LAB — HEPATITIS C ANTIBODY: Hep C Virus Ab: <0.1 s/co ratio (ref 0.0–0.9)

## 2018-07-08 LAB — RPR: RPR: NONREACTIVE

## 2018-07-08 MED ORDER — VITAMIN D (ERGOCALCIFEROL) 1.25 MG (50000 UNIT) PO CAPS: 50000.0000 [IU] | ORAL_CAPSULE | ORAL | 0 refills | Status: DC

## 2018-07-08 MED FILL — VIT D2 1.25 MG (50,000 UNIT: 1.25 MG | 56 days supply | Qty: 8 | Fill #0

## 2018-07-08 NOTE — Addendum Note (Signed)
Addended by: Reva BoresPRATT, Chinmayi Rumer S on: 07/08/2018 11:54 AM   Modules accepted: Orders

## 2018-07-10 LAB — CYTOLOGY - PAP
CHLAMYDIA, DNA PROBE: NEGATIVE
Diagnosis: NEGATIVE
HPV (WINDOPATH): NOT DETECTED
NEISSERIA GONORRHEA: NEGATIVE
Trichomonas: NEGATIVE

## 2018-08-26 ENCOUNTER — Other Ambulatory Visit: Payer: Self-pay

## 2018-08-26 ENCOUNTER — Encounter (INDEPENDENT_AMBULATORY_CARE_PROVIDER_SITE_OTHER): Payer: Self-pay

## 2018-09-09 ENCOUNTER — Ambulatory Visit (INDEPENDENT_AMBULATORY_CARE_PROVIDER_SITE_OTHER): Payer: Self-pay | Admitting: Family Medicine

## 2018-09-23 ENCOUNTER — Ambulatory Visit (INDEPENDENT_AMBULATORY_CARE_PROVIDER_SITE_OTHER): Payer: Self-pay | Admitting: Family Medicine

## 2019-05-11 ENCOUNTER — Encounter: Payer: Self-pay | Admitting: Family Medicine

## 2019-05-11 ENCOUNTER — Other Ambulatory Visit: Payer: Self-pay | Admitting: Family Medicine

## 2019-05-11 DIAGNOSIS — N6310 Unspecified lump in the right breast, unspecified quadrant: Secondary | ICD-10-CM

## 2019-05-11 NOTE — Progress Notes (Signed)
Has noted a breast lesion with some nipple discharge. On exam a 1.5 x 2 cm nodule is noted at 8 o'clock. It is mobile and firm. Will set up imaging.

## 2019-05-11 NOTE — Progress Notes (Signed)
Has 2 x 1.5 cm mobile firm mass in right breast.

## 2019-05-18 ENCOUNTER — Ambulatory Visit
Admission: RE | Admit: 2019-05-18 | Discharge: 2019-05-18 | Disposition: A | Discharge status: Home / Self Care | Payer: No Typology Code available for payment source | Source: Ambulatory Visit | Attending: Family Medicine | Admitting: Family Medicine

## 2019-05-18 ENCOUNTER — Other Ambulatory Visit: Payer: Self-pay

## 2019-05-18 ENCOUNTER — Other Ambulatory Visit: Payer: Self-pay | Admitting: Family Medicine

## 2019-05-18 DIAGNOSIS — N6310 Unspecified lump in the right breast, unspecified quadrant: Secondary | ICD-10-CM

## 2019-06-14 ENCOUNTER — Encounter: Payer: Self-pay | Admitting: Radiology

## 2019-06-21 ENCOUNTER — Ambulatory Visit
Admission: RE | Admit: 2019-06-21 | Discharge: 2019-06-21 | Disposition: A | Discharge status: Home / Self Care | Payer: No Typology Code available for payment source | Source: Ambulatory Visit | Attending: Family Medicine | Admitting: Family Medicine

## 2019-06-21 ENCOUNTER — Other Ambulatory Visit: Payer: Self-pay

## 2019-06-21 DIAGNOSIS — N6310 Unspecified lump in the right breast, unspecified quadrant: Secondary | ICD-10-CM

## 2019-07-27 ENCOUNTER — Ambulatory Visit: Payer: No Typology Code available for payment source | Admitting: Family Medicine

## 2019-08-03 ENCOUNTER — Encounter: Payer: Self-pay | Admitting: Obstetrics & Gynecology

## 2019-08-03 ENCOUNTER — Other Ambulatory Visit: Payer: Self-pay

## 2019-08-03 ENCOUNTER — Ambulatory Visit (INDEPENDENT_AMBULATORY_CARE_PROVIDER_SITE_OTHER): Payer: No Typology Code available for payment source | Admitting: Obstetrics & Gynecology

## 2019-08-03 VITALS — BP 140/82 | HR 98 | Ht 65.0 in | Wt 240.0 lb

## 2019-08-03 DIAGNOSIS — Z01419 Encounter for gynecological examination (general) (routine) without abnormal findings: Secondary | ICD-10-CM | POA: Diagnosis not present

## 2019-08-03 DIAGNOSIS — Z975 Presence of (intrauterine) contraceptive device: Secondary | ICD-10-CM

## 2019-08-03 DIAGNOSIS — Z1151 Encounter for screening for human papillomavirus (HPV): Secondary | ICD-10-CM | POA: Diagnosis not present

## 2019-08-03 DIAGNOSIS — Z124 Encounter for screening for malignant neoplasm of cervix: Secondary | ICD-10-CM | POA: Diagnosis not present

## 2019-08-03 DIAGNOSIS — Z113 Encounter for screening for infections with a predominantly sexual mode of transmission: Secondary | ICD-10-CM | POA: Diagnosis not present

## 2019-08-03 DIAGNOSIS — E559 Vitamin D deficiency, unspecified: Secondary | ICD-10-CM

## 2019-08-03 NOTE — Patient Instructions (Signed)
Preventive Care 21-35 Years Old, Female Preventive care refers to visits with your health care provider and lifestyle choices that can promote health and wellness. This includes:  A yearly physical exam. This may also be called an annual well check.  Regular dental visits and eye exams.  Immunizations.  Screening for certain conditions.  Healthy lifestyle choices, such as eating a healthy diet, getting regular exercise, not using drugs or products that contain nicotine and tobacco, and limiting alcohol use. What can I expect for my preventive care visit? Physical exam Your health care provider will check your:  Height and weight. This may be used to calculate body mass index (BMI), which tells if you are at a healthy weight.  Heart rate and blood pressure.  Skin for abnormal spots. Counseling Your health care provider may ask you questions about your:  Alcohol, tobacco, and drug use.  Emotional well-being.  Home and relationship well-being.  Sexual activity.  Eating habits.  Work and work environment.  Method of birth control.  Menstrual cycle.  Pregnancy history. What immunizations do I need?  Influenza (flu) vaccine  This is recommended every year. Tetanus, diphtheria, and pertussis (Tdap) vaccine  You may need a Td booster every 10 years. Varicella (chickenpox) vaccine  You may need this if you have not been vaccinated. Human papillomavirus (HPV) vaccine  If recommended by your health care provider, you may need three doses over 6 months. Measles, mumps, and rubella (MMR) vaccine  You may need at least one dose of MMR. You may also need a second dose. Meningococcal conjugate (MenACWY) vaccine  One dose is recommended if you are age 19-21 years and a first-year college student living in a residence hall, or if you have one of several medical conditions. You may also need additional booster doses. Pneumococcal conjugate (PCV13) vaccine  You may need  this if you have certain conditions and were not previously vaccinated. Pneumococcal polysaccharide (PPSV23) vaccine  You may need one or two doses if you smoke cigarettes or if you have certain conditions. Hepatitis A vaccine  You may need this if you have certain conditions or if you travel or work in places where you may be exposed to hepatitis A. Hepatitis B vaccine  You may need this if you have certain conditions or if you travel or work in places where you may be exposed to hepatitis B. Haemophilus influenzae type b (Hib) vaccine  You may need this if you have certain conditions. You may receive vaccines as individual doses or as more than one vaccine together in one shot (combination vaccines). Talk with your health care provider about the risks and benefits of combination vaccines. What tests do I need?  Blood tests  Lipid and cholesterol levels. These may be checked every 5 years starting at age 20.  Hepatitis C test.  Hepatitis B test. Screening  Diabetes screening. This is done by checking your blood sugar (glucose) after you have not eaten for a while (fasting).  Sexually transmitted disease (STD) testing.  BRCA-related cancer screening. This may be done if you have a family history of breast, ovarian, tubal, or peritoneal cancers.  Pelvic exam and Pap test. This may be done every 3 years starting at age 21. Starting at age 30, this may be done every 5 years if you have a Pap test in combination with an HPV test. Talk with your health care provider about your test results, treatment options, and if necessary, the need for more tests.   Follow these instructions at home: Eating and drinking   Eat a diet that includes fresh fruits and vegetables, whole grains, lean protein, and low-fat dairy.  Take vitamin and mineral supplements as recommended by your health care provider.  Do not drink alcohol if: ? Your health care provider tells you not to drink. ? You are  pregnant, may be pregnant, or are planning to become pregnant.  If you drink alcohol: ? Limit how much you have to 0-1 drink a day. ? Be aware of how much alcohol is in your drink. In the [place] one drink equals one 12 oz bottle of beer (355 mL), one 5 oz glass of wine (148 mL), or one 1 oz glass of hard liquor (44 mL). Lifestyle  Take daily care of your teeth and gums.  Stay active. Exercise for at least 30 minutes on 5 or more days each week.  Do not use any products that contain nicotine or tobacco, such as cigarettes, e-cigarettes, and chewing tobacco. If you need help quitting, ask your health care provider.  If you are sexually active, practice safe sex. Use a condom or other form of birth control (contraception) in order to prevent pregnancy and STIs (sexually transmitted infections). If you plan to become pregnant, see your health care provider for a preconception visit. What's next?  Visit your health care provider once a year for a well check visit.  Ask your health care provider how often you should have your eyes and teeth checked.  Stay up to date on all vaccines. This information is not intended to replace advice given to you by your health care provider. Make sure you discuss any questions you have with your health care provider. Document Revised: 02/12/2018 Document Reviewed: 02/12/2018 Elsevier Patient Education  2020 [employer].

## 2019-08-03 NOTE — Progress Notes (Signed)
GYNECOLOGY ANNUAL PREVENTATIVE CARE ENCOUNTER NOTE  History:     Savannah Holland is a 34 y.o. G77P1001 female here for a routine annual gynecologic exam.  Current complaints: none.   Denies abnormal vaginal bleeding, discharge, pelvic pain, problems with intercourse or other gynecologic concerns.    Gynecologic History Patient's last menstrual period was 07/23/2019. Contraception: Mirena IUD placed 2019 Last Pap: 07/07/2018. Results were: normal with negative HPV Last mammogram: 06/21/2019. Results were: normal; done for palpable lump.  No need for screening mammograms until age 50.   Obstetric History OB History  Gravida Para Term Preterm AB Living  1 1 1  0 0 1  SAB TAB Ectopic Multiple Live Births  0 0 0 0 1    # Outcome Date GA Lbr Len/2nd Weight Sex Delivery Anes PTL Lv  1 Term      Vag-Spont   LIV    Past Medical History:  Diagnosis Date  . Allergy   . GERD (gastroesophageal reflux disease)     History reviewed. No pertinent surgical history.  Current Outpatient Medications on File Prior to Visit  Medication Sig Dispense Refill  . levonorgestrel (MIRENA) 20 MCG/24HR IUD 1 each by Intrauterine route once.     No current facility-administered medications on file prior to visit.    No Known Allergies  Social History:  reports that she has never smoked. She has never used smokeless tobacco. She reports current alcohol use. She reports that she does not use drugs.  Family History  Problem Relation Age of Onset  . Fibroids Mother   . Hyperlipidemia Father   . GER disease Father   . Prostate cancer Father   . Diabetes Maternal Grandmother   . Diabetes Paternal Grandmother   . Breast cancer Maternal Aunt    The following portions of the patient's history were reviewed and updated as appropriate: allergies, current medications, past family history, past medical history, past social history, past surgical history and problem list.  Review of Systems Pertinent items  noted in HPI and remainder of comprehensive ROS otherwise negative.  Physical Exam:  BP 140/82   Pulse 98   Ht 5\' 5"  (1.651 m)   Wt 240 lb (108.9 kg)   LMP 07/23/2019   BMI 39.94 kg/m  CONSTITUTIONAL: Well-developed, well-nourished female in no acute distress.  HENT:  Normocephalic, atraumatic, External right and left ear normal. Oropharynx is clear and moist EYES: Conjunctivae and EOM are normal. Pupils are equal, round, and reactive to light. No scleral icterus.  NECK: Normal range of motion, supple, no masses.  Normal thyroid.  SKIN: Skin is warm and dry. No rash noted. Not diaphoretic. No erythema. No pallor. MUSCULOSKELETAL: Normal range of motion. No tenderness.  No cyanosis, clubbing, or edema.  2+ distal pulses. NEUROLOGIC: Alert and oriented to person, place, and time. Normal reflexes, muscle tone coordination.  PSYCHIATRIC: Normal mood and affect. Normal behavior. Normal judgment and thought content. CARDIOVASCULAR: Normal heart rate noted, regular rhythm RESPIRATORY: Clear to auscultation bilaterally. Effort and breath sounds normal, no problems with respiration noted. BREASTS: Symmetric in size. No masses, tenderness, skin changes, nipple drainage, or lymphadenopathy bilaterally. ABDOMEN: Soft, no distention noted.  No tenderness, rebound or guarding.  PELVIC: Normal appearing external genitalia and urethral meatus; normal appearing vaginal mucosa and cervix. Mirena IUD strings seen.  No abnormal discharge noted.  Pap smear obtained.  Normal uterine size, no other palpable masses, no uterine or adnexal tenderness.   Assessment and Plan:  1. Mirena IUD (intrauterine device) in place since 2019 No issues with the IUD. Will keep in place for up to seven years.   2. Well woman exam with routine gynecological exam - CBC - TSH - Hemoglobin A1c - Comprehensive metabolic panel - Lipid panel - VITAMIN D 25 Hydroxy (Vit-D Deficiency, Fractures) - Hepatitis B surface antigen -  Hepatitis C antibody - HIV Antibody (routine testing w rflx) - RPR - Cytology - PAP Will follow up results of results, pap smear and manage accordingly. Routine preventative health maintenance measures emphasized. Please refer to After Visit Summary for other counseling recommendations.      Jaynie Collins, MD, FACOG Obstetrician & Gynecologist, [facility] for [employer], [facility]

## 2019-08-04 LAB — COMPREHENSIVE METABOLIC PANEL
ALT: 14 IU/L (ref 0–32)
AST: 16 IU/L (ref 0–40)
Albumin/Globulin Ratio: 1.4 (ref 1.2–2.2)
Albumin: 4.2 g/dL (ref 3.8–4.8)
Alkaline Phosphatase: 128 IU/L — ABNORMAL HIGH (ref 39–117)
BUN/Creatinine Ratio: 9 (ref 9–23)
BUN: 8 mg/dL (ref 6–20)
Bilirubin Total: 0.7 mg/dL (ref 0.0–1.2)
CO2: 23 mmol/L (ref 20–29)
Calcium: 9.7 mg/dL (ref 8.7–10.2)
Chloride: 102 mmol/L (ref 96–106)
Creatinine, Ser: 0.85 mg/dL (ref 0.57–1.00)
GFR calc Af Amer: 103 mL/min/{1.73_m2} (ref >59)
GFR calc non Af Amer: 90 mL/min/{1.73_m2} (ref >59)
Globulin, Total: 3.1 g/dL (ref 1.5–4.5)
Glucose: 83 mg/dL (ref 65–99)
Potassium: 4.4 mmol/L (ref 3.5–5.2)
Sodium: 140 mmol/L (ref 134–144)
Total Protein: 7.3 g/dL (ref 6.0–8.5)

## 2019-08-04 LAB — HIV ANTIBODY (ROUTINE TESTING W REFLEX): HIV Screen 4th Generation wRfx: NONREACTIVE

## 2019-08-04 LAB — HEMOGLOBIN A1C
Est. average glucose Bld gHb Est-mCnc: 108 mg/dL
Hgb A1c MFr Bld: 5.4 % (ref 4.8–5.6)

## 2019-08-04 LAB — LIPID PANEL
Chol/HDL Ratio: 4 ratio (ref 0.0–4.4)
Cholesterol, Total: 199 mg/dL (ref 100–199)
HDL: 50 mg/dL (ref >39)
LDL Chol Calc (NIH): 127 mg/dL — ABNORMAL HIGH (ref 0–99)
Triglycerides: 121 mg/dL (ref 0–149)
VLDL Cholesterol Cal: 22 mg/dL (ref 5–40)

## 2019-08-04 LAB — VITAMIN D 25 HYDROXY (VIT D DEFICIENCY, FRACTURES): Vit D, 25-Hydroxy: 22.5 ng/mL — ABNORMAL LOW (ref 30.0–100.0)

## 2019-08-04 LAB — CBC
Hematocrit: 41.4 % (ref 34.0–46.6)
Hemoglobin: 13.2 g/dL (ref 11.1–15.9)
MCH: 25.1 pg — ABNORMAL LOW (ref 26.6–33.0)
MCHC: 31.9 g/dL (ref 31.5–35.7)
MCV: 79 fL (ref 79–97)
Platelets: 338 10*3/uL (ref 150–450)
RBC: 5.26 x10E6/uL (ref 3.77–5.28)
RDW: 15.5 % — ABNORMAL HIGH (ref 11.7–15.4)
WBC: 5.8 10*3/uL (ref 3.4–10.8)

## 2019-08-04 LAB — RPR: RPR Ser Ql: NONREACTIVE

## 2019-08-04 LAB — HEPATITIS B SURFACE ANTIGEN: Hepatitis B Surface Ag: NEGATIVE

## 2019-08-04 LAB — HEPATITIS C ANTIBODY: Hep C Virus Ab: <0.1 s/co ratio (ref 0.0–0.9)

## 2019-08-04 LAB — TSH: TSH: 1.19 u[IU]/mL (ref 0.450–4.500)

## 2019-08-05 LAB — CYTOLOGY - PAP
Chlamydia: NEGATIVE
Comment: NEGATIVE
Comment: NEGATIVE
Comment: NEGATIVE
Comment: NORMAL
Diagnosis: UNDETERMINED — AB
High risk HPV: NEGATIVE
Neisseria Gonorrhea: NEGATIVE
Trichomonas: NEGATIVE

## 2019-08-06 MED ORDER — VITAMIN D (ERGOCALCIFEROL) 1.25 MG (50000 UNIT) PO CAPS: 50000.0000 [IU] | ORAL_CAPSULE | ORAL | 5 refills | Status: DC

## 2019-08-06 MED FILL — VIT D2 1.25 MG (50,000 UNIT: 1.25 MG | 28 days supply | Qty: 4 | Fill #0

## 2019-08-06 NOTE — Addendum Note (Signed)
Addended by: Jaynie Collins A on: 08/06/2019 08:10 AM   Modules accepted: Orders

## 2019-09-06 MED FILL — VIT D2 1.25 MG (50,000 UNIT: 1.25 MG | 28 days supply | Qty: 4 | Fill #1

## 2019-10-13 MED FILL — VIT D2 1.25 MG (50,000 UNIT: 1.25 MG | 28 days supply | Qty: 4 | Fill #2

## 2020-05-23 ENCOUNTER — Encounter (INDEPENDENT_AMBULATORY_CARE_PROVIDER_SITE_OTHER): Payer: Self-pay | Admitting: Ophthalmology

## 2020-05-23 DIAGNOSIS — H3581 Retinal edema: Secondary | ICD-10-CM

## 2020-05-25 NOTE — Progress Notes (Signed)
[facility] - Clinic Note  05/29/2020     CHIEF COMPLAINT Patient presents for Retina Evaluation   HISTORY OF PRESENT ILLNESS: Savannah Holland is a 34 y.o. female who presents to the clinic today for:   HPI    Retina Evaluation    In both eyes.  This started weeks ago.  Duration of weeks.  Context:  distance vision and near vision.          Comments    NP retina eval Pt states she has not noticed decrease in her vision.  Pt states she has worn corrective lenses since age 50.  Pt primarily wears contact lenses but has glasses as back up.   Pt sleeps in contact lenses for 3 days before removing.  Patient denies eye pain or discomfort.  Pt has history of floaters but is unable to specify which eye.  Pt denies any new or worsening floaters or fol OU.       Last edited by Corrinne Eagle on 05/29/2020  9:40 AM. (History)    pt is here on the referral of Dr. Renold Don at [facility], pt states she went there for a routine eye exam for glasses and CL, pt states her first exam with them was virtual so she was not able to have her eyes dilated, she states they took retinal photos and "saw something" so they had her come back for a dilated exam, pt states she has always seen floaters and doesn't really think much about them, pt denies fol  Referring physician: [facility] [place], [place] [address] [place],  [place] [address]  HISTORICAL INFORMATION:   Selected notes from the MEDICAL RECORD NUMBER Referred by Dr. 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 neomycin-polymyxin b-dexamethasone (MAXITROL) 3.5-10000-0.1 OINT Place 1 application into the right eye 4 (four) times daily.   No current facility-administered medications for this visit. (Ophthalmic Drugs)   Current Outpatient Medications (Other)   Medication Sig  . levonorgestrel (MIRENA) 20 MCG/24HR IUD 1 each by Intrauterine route once.  . Vitamin D, Ergocalciferol, (DRISDOL) 1.25 MG (50000 UNIT) CAPS capsule Take 1 capsule (50,000 Units total) by mouth every 7 (seven) days.   No current facility-administered medications for this visit. (Other)      REVIEW OF SYSTEMS: ROS    Positive for: Eyes   Negative for: Constitutional, Gastrointestinal, Neurological, Skin, Genitourinary, Musculoskeletal, HENT, Endocrine, Cardiovascular, Respiratory, Psychiatric, Allergic/Imm, Heme/Lymph   Last edited by Corrinne Eagle on 05/29/2020  9:23 AM. (History)       ALLERGIES No Known Allergies  PAST MEDICAL HISTORY Past Medical History:  Diagnosis Date  . Allergy   . GERD (gastroesophageal reflux disease)    History reviewed. No pertinent surgical history.  FAMILY HISTORY Family History  Problem Relation Age of Onset  . Fibroids Mother   . Hyperlipidemia Father   . GER disease Father   . Prostate cancer Father   . Diabetes Maternal Grandmother   . Diabetes Paternal Grandmother   . Breast cancer Maternal Aunt     SOCIAL HISTORY Social History   Tobacco Use  . Smoking status: Never Smoker  . Smokeless tobacco: Never Used  Vaping Use  . Vaping Use: Never used  Substance Use Topics  . Alcohol use: Yes    Comment:  rare  . Drug use: No         OPHTHALMIC EXAM:  Base Eye Exam    Visual Acuity (Snellen - Linear)      Right Left   Dist cc 20/20 20/20 -2   Correction: Contacts       Tonometry (Tonopen, 9:35 AM)      Right Left   Pressure 18 16       Pupils      Dark Light Shape React APD   Right 5 4 Round Brisk 0   Left 5 4 Round Brisk 0       Visual Fields      Left Right    Full Full       Extraocular Movement      Right Left    Full Full       Neuro/Psych    Oriented x3: Yes   Mood/Affect: Normal       Dilation    Both eyes: 1.0% Mydriacyl, 2.5% Phenylephrine @ 9:35 AM        Slit  Lamp and Fundus Exam    Slit Lamp Exam      Right Left   Lids/Lashes Normal Normal   Conjunctiva/Sclera White and quiet White and quiet   Cornea Clear Clear   Anterior Chamber Deep and quiet Deep and quiet   Iris Round and dilated Round and dilated   Lens Clear Clear   Vitreous Vitreous syneresis, no pigment Vitreous syneresis, no pigment       Fundus Exam      Right Left   Disc Pink and Sharp, Tilted disc, temporal PPA Pink and Sharp, mildly Tilted disc, temporal PPA, Compact   Macula Flat, Good foveal reflex, mild RPE mottling, No heme or edema Flat, Good foveal reflex, mild RPE mottling, No heme or edema   Vessels Normal Normal   Periphery Shallow pocket of SRF superotemporal periphery with demarcation line extending from 1100 to 0800, pigment deposition on superior portion of demarcation line, lattice with round, atrophic holes at 1030 and 1045 within detachment, pigmented VR tuft at 0730 outside of detachment Attached, focal pigmented lattice IT quad at 0400, 0430 (with atrophic holes) and 0500        Refraction    Wearing Rx      Sphere Cylinder Axis   Right -8.25 +0.50 055   Left -8.25 +0.50 004       Wearing Rx #2      Sphere Cylinder Axis   Right -7.50 Sphere    Left -7.50 Sphere    Type: CL       Wearing Rx Comments   CLs: Air Optix Hydraglyde       Manifest Refraction      Sphere Cylinder Axis Dist VA   Right -8.00 +0.50 055 20/20   Left -8.50 +0.50 005 20/20-          IMAGING AND PROCEDURES  Imaging and Procedures for 05/29/2020  OCT, Retina - OU - Both Eyes       Right Eye Quality was good. Central Foveal Thickness: 257. Progression has no prior data. Findings include normal foveal contour, no IRF, vitreomacular adhesion , subretinal fluid (+SRF temporal periphery caught one widefield).   Left Eye Quality was good. Central Foveal Thickness: 255. Progression has no prior data. Findings include normal foveal contour, no IRF, no SRF, vitreomacular  adhesion .   Notes *Images captured and stored on drive  Diagnosis / Impression:  OD:  NFP; no IRF/SRF centrally; +SRF temporal periphery caught on widefield OS: NFP, no IRF/SRF  Clinical management:  See below  Abbreviations: NFP - Normal foveal profile. CME - cystoid macular edema. PED - pigment epithelial detachment. IRF - intraretinal fluid. SRF - subretinal fluid. EZ - ellipsoid zone. ERM - epiretinal membrane. ORA - outer retinal atrophy. ORT - outer retinal tubulation. SRHM - subretinal hyper-reflective material. IRHM - intraretinal hyper-reflective material        Pneumatic Retinopexy - OD - Right Eye       PROCEDURE NOTE  Diagnosis:  Retinal detachment with retinal breaks, RIGHT EYE  Procedure:  Pneumatic cryopexy with C3F8 gas injection, RIGHT EYE  CPT:  21194  Surgeon: Rennis Chris, M.D.,Ph.D.  Anesthesia:  Subconjunctival Lidocaine   The patient was brought to the procedure room. Informed consent was obtained for cryopexy of breaks and intravitreal gas injection. The diagnosis was reviewed with the patient and all questions were answered. The risks of the procedure including potential systemic risks like stroke and heart attack and other thrombotic events as well as the local risks like infection, endophthalmitis, retinal detachment were discussed. The patient consented to the procedure.   The RIGHT eye was marked and a time out was performed identifying the correct eye. Subsequently, the patient was placed in the supine position. Topical anesthetic drops were given and a lid speculum was placed to expose the eye. Subsequently, 2% Lidocaine was injected subconjunctivally in the quadrants of the tears giving adequate anesthesia. Using indirect ophthalmoscopy the cryo probe was positioned beneath the breaks at 1030 and 1045 and choroidal and retinal whitening was achieved 360 degrees around the tear margins. A small unrelated pigmented VR was identified at 0730 and  cryopexy was performed on that lesion. The superotemporal quadrant was then cleaned with Betadine swabs and allowed to dry. At this time, 4 mm posterior to the limbus utilizing a 30-gauge needle, 0.4 cc of pure, 100% C3F8 gas was injected into the vitreous cavity under direct visualization. The needle was then withdrawn from the eye and the retina and gas bubble were visualized. An AC tap was performed to normalize the pressure and the central retinal artery was noted to be patent. Betadine was then reapplied to the injection sites and then rinsed with sterile BSS. A drop of polymixin ophthalmic soln and Maxitrol opthalmic ung were applied to the eye, then the eye was double patched with eye pads. An arrow was drawn on the dressing to assist with post-operative positioning. There were no complications. Discharge and post-operative instructions were reviewed.                 ASSESSMENT/PLAN:    ICD-10-CM   1. Right retinal detachment  H33.21 Pneumatic Retinopexy - OD - Right Eye  2. 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 Rhegmatogenous retinal detachment, OD - detached from 11 to 8 oclock, with atrophic holes at 1030 and 1045 - The incidence, risk factors, and natural history of retinal detachment was discussed with patient.   - Potential treatment options including delimiting laser, pneumatic retinopexy, scleral buckle, and vitrectomy, cryotherapy and laser, and the use of air, gas, and oil discussed with patient. - The risks of blindness, loss of vision, infection, hemorrhage, cataract progression or lens displacement were discussed with patient. - recommend pneumatic retinopexy OD today, 12.13.21 -  pt wishes to proceed with procedure - RBA of procedure discussed, questions answered - informed consent obtained and signed - see procedure note - start Maxitrol ung QID OD  tonight - post op drop and positioning instructions reviewed - f/u 1 day, RD OD, DFE, OCT  3,4. Lattice degeneration w/ atrophic holes, both eyes  - OD: lattice with atrophic holes and RD as above - OS: focal pigmented lattice IT at 0400, 0430 and 0500 --0430 with atrophic holes - discussed findings, prognosis, and treatment options including observation - recommend laser retinopexy OS once OD is stable from RD repair - monitor for now    Ophthalmic Meds Ordered this visit:  Meds ordered this encounter  Medications  . neomycin-polymyxin b-dexamethasone (MAXITROL) 3.5-10000-0.1 OINT    Sig: Place 1 application into the right eye 4 (four) times daily.    Dispense:  3.5 g    Refill:  2       Return in about 1 day (around 05/30/2020) for f/u RD OD, DFE, OCT.  There are no Patient Instructions on file for this visit.   Explained the diagnoses, plan, and follow up with the patient and they expressed understanding.  Patient expressed understanding of the importance of proper follow up care.   This document serves as a record of services personally performed by Karie ChimeraBrian G. Raynor Calcaterra, MD, PhD. It was created on their behalf by Herby AbrahamAshley English, COA, an ophthalmic technician. The creation of this record is the provider's dictation and/or activities during the visit.    Electronically signed by: Herby AbrahamAshley English, COA 12.09.2021 1:04 PM   This document serves as a record of services personally performed by Karie ChimeraBrian G. Jennifier Smitherman, MD, PhD. It was created on their behalf by Glee ArvinAmanda J. Manson PasseyBrown, OA an ophthalmic technician. The creation of this record is the provider's dictation and/or activities during the visit.    Electronically signed by: Glee ArvinAmanda J. Manson PasseyBrown, [place] 12.13.2021 1:04 PM   Karie ChimeraBrian G. Solangel Mcmanaway, M.D., Ph.D. Diseases & Surgery of the Retina and Vitreous [facility]  I have reviewed the above documentation for accuracy and completeness, and I agree with the above. Karie ChimeraBrian G. Anthonio Mizzell,  M.D., Ph.D. 05/29/20 1:04 PM   Abbreviations: M myopia (nearsighted); A astigmatism; H hyperopia (farsighted); P presbyopia; Mrx spectacle prescription;  CTL contact lenses; OD right eye; OS left eye; OU both eyes  XT exotropia; ET esotropia; PEK punctate epithelial keratitis; PEE punctate epithelial erosions; DES dry eye syndrome; MGD meibomian gland dysfunction; ATs artificial tears; PFAT's preservative free artificial tears; NSC nuclear sclerotic cataract; PSC posterior subcapsular cataract; ERM epi-retinal membrane; PVD posterior vitreous detachment; RD retinal detachment; DM diabetes mellitus; DR diabetic retinopathy; NPDR non-proliferative diabetic retinopathy; PDR proliferative diabetic retinopathy; CSME clinically significant macular edema; DME diabetic macular edema; dbh dot blot hemorrhages; CWS cotton wool spot; POAG primary open angle glaucoma; C/D cup-to-disc ratio; HVF humphrey visual field; GVF goldmann visual field; OCT optical coherence tomography; IOP intraocular pressure; BRVO Branch retinal vein occlusion; CRVO central retinal vein occlusion; CRAO central retinal artery occlusion; BRAO branch retinal artery occlusion; RT retinal tear; SB scleral buckle; PPV pars plana vitrectomy; VH Vitreous hemorrhage; PRP panretinal laser photocoagulation; IVK intravitreal kenalog; VMT vitreomacular traction; MH Macular hole;  NVD neovascularization of the disc; NVE neovascularization elsewhere; AREDS age related eye disease study; ARMD age related macular degeneration; POAG primary open angle glaucoma; EBMD epithelial/anterior basement membrane dystrophy; ACIOL anterior chamber intraocular lens; IOL intraocular lens; PCIOL posterior chamber intraocular lens; Phaco/IOL phacoemulsification with  intraocular lens placement; [place] photorefractive keratectomy; LASIK laser assisted in situ keratomileusis; HTN hypertension; DM diabetes mellitus; COPD chronic obstructive pulmonary disease

## 2020-05-29 ENCOUNTER — Other Ambulatory Visit: Payer: Self-pay

## 2020-05-29 ENCOUNTER — Other Ambulatory Visit (INDEPENDENT_AMBULATORY_CARE_PROVIDER_SITE_OTHER): Payer: Self-pay | Admitting: Ophthalmology

## 2020-05-29 ENCOUNTER — Ambulatory Visit (INDEPENDENT_AMBULATORY_CARE_PROVIDER_SITE_OTHER): Payer: No Typology Code available for payment source | Admitting: Ophthalmology

## 2020-05-29 ENCOUNTER — Encounter (INDEPENDENT_AMBULATORY_CARE_PROVIDER_SITE_OTHER): Payer: Self-pay | Admitting: Ophthalmology

## 2020-05-29 DIAGNOSIS — H33323 Round hole, bilateral: Secondary | ICD-10-CM | POA: Diagnosis not present

## 2020-05-29 DIAGNOSIS — H3581 Retinal edema: Secondary | ICD-10-CM | POA: Diagnosis not present

## 2020-05-29 DIAGNOSIS — H35413 Lattice degeneration of retina, bilateral: Secondary | ICD-10-CM | POA: Diagnosis not present

## 2020-05-29 DIAGNOSIS — H3321 Serous retinal detachment, right eye: Secondary | ICD-10-CM | POA: Diagnosis not present

## 2020-05-29 MED ORDER — NEOMYCIN-POLYMYXIN-DEXAMETH 3.5-10000-0.1 OP OINT: 1.0000 "application " | TOPICAL_OINTMENT | Freq: Four times a day (QID) | OPHTHALMIC | 2 refills | Status: DC

## 2020-05-29 MED FILL — NEO/POLY/DEXAMET EYE OINT: 3.5-10000-0 | 3 days supply | Qty: 4 | Fill #0

## 2020-05-30 ENCOUNTER — Ambulatory Visit (INDEPENDENT_AMBULATORY_CARE_PROVIDER_SITE_OTHER): Payer: No Typology Code available for payment source | Admitting: Ophthalmology

## 2020-05-30 ENCOUNTER — Encounter (INDEPENDENT_AMBULATORY_CARE_PROVIDER_SITE_OTHER): Payer: Self-pay | Admitting: Ophthalmology

## 2020-05-30 DIAGNOSIS — H3581 Retinal edema: Secondary | ICD-10-CM

## 2020-05-30 DIAGNOSIS — H33323 Round hole, bilateral: Secondary | ICD-10-CM

## 2020-05-30 DIAGNOSIS — H35413 Lattice degeneration of retina, bilateral: Secondary | ICD-10-CM

## 2020-05-30 DIAGNOSIS — H3321 Serous retinal detachment, right eye: Secondary | ICD-10-CM

## 2020-05-30 NOTE — Progress Notes (Signed)
[facility] - Clinic Note  06/01/2020     CHIEF COMPLAINT Patient presents for Post-op Follow-up   HISTORY OF PRESENT ILLNESS: Savannah Holland is a 34 y.o. female who presents to the clinic today for:   HPI    Post-op Follow-up    In right eye.  Discomfort includes none.  Vision is stable.  I, the attending physician,  performed the HPI with the patient and updated documentation appropriately.          Comments    Pt states vision seems stable OD.  Patient has had feeling of pressure above right eye around brow line.  Patient denies eye pain.  Patient denies any new or worsening floaters or fol OU.       Last edited by Rennis Chris, MD on 06/01/2020 11:02 PM. (History)    pt states she slept better on Tuesday night, last night was not so good, she states twice she has had a aching above her eye, she states it only happened on the right side of her head and was intermittent, she took Tylenol which helped with the pain, she is still seeing multiple bubbles  Referring physician: Reva Bores, MD [address] [place],  [place] [address]  HISTORICAL INFORMATION:   Selected notes from the MEDICAL RECORD NUMBER Referred by Dr. 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 neomycin-polymyxin b-dexamethasone (MAXITROL) 3.5-10000-0.1 OINT Place 1 application into the right eye 4 (four) times daily.   No current facility-administered medications for this visit. (Ophthalmic Drugs)   Current Outpatient Medications (Other)  Medication Sig  . levonorgestrel (MIRENA) 20 MCG/24HR IUD 1 each by Intrauterine route once.  . Vitamin D, Ergocalciferol, (DRISDOL) 1.25 MG (50000 UNIT) CAPS capsule Take 1 capsule (50,000 Units total) by mouth every 7 (seven) days.   No current facility-administered medications for this visit.  (Other)      REVIEW OF SYSTEMS: ROS    Positive for: Eyes   Negative for: Constitutional, Gastrointestinal, Neurological, Skin, Genitourinary, Musculoskeletal, HENT, Endocrine, Cardiovascular, Respiratory, Psychiatric, Allergic/Imm, Heme/Lymph   Last edited by Corrinne Eagle on 06/01/2020  7:54 AM. (History)       ALLERGIES No Known Allergies  PAST MEDICAL HISTORY Past Medical History:  Diagnosis Date  . Allergy   . GERD (gastroesophageal reflux disease)    History reviewed. No pertinent surgical history.  FAMILY HISTORY Family History  Problem Relation Age of Onset  . Fibroids Mother   . Hyperlipidemia Father   . GER disease Father   . Prostate cancer Father   . Diabetes Maternal Grandmother   . Diabetes Paternal Grandmother   . Breast cancer Maternal Aunt     SOCIAL HISTORY Social History   Tobacco Use  . Smoking status: Never Smoker  . Smokeless tobacco: Never Used  Vaping Use  . Vaping Use: Never used  Substance Use Topics  . Alcohol use: Yes    Comment: rare  . Drug use: No         OPHTHALMIC EXAM:  Base Eye Exam    Visual Acuity (Snellen - Linear)      Right Left   Dist cc 20/80 -2 20/20 -1   Dist ph cc 20/40 +1    Correction: Glasses       Tonometry (Tonopen, 7:58 AM)  Right Left   Pressure 11 13       Pupils      Dark Light Shape React APD   Right 5 4 Round Brisk 0   Left 5 4 Round Brisk 0       Extraocular Movement      Right Left    Full Full       Neuro/Psych    Oriented x3: Yes   Mood/Affect: Normal       Dilation    Right eye: 1.0% Mydriacyl, 2.5% Phenylephrine @ 7:58 AM        Slit Lamp and Fundus Exam    Slit Lamp Exam      Right Left   Lids/Lashes Normal Normal   Conjunctiva/Sclera Subconjunctival hemorrhage, Chemosis -- both improving White and quiet   Cornea Clear Clear   Anterior Chamber Deep and quiet Deep and quiet   Iris Round and dilated Round and dilated   Lens Clear Clear   Vitreous  Vitreous syneresis, no pigment, multiple gas bubbles approx 35% Vitreous syneresis, no pigment       Fundus Exam      Right Left   Disc Pink and Sharp, Tilted disc, temporal PPA Pink and Sharp, mildly Tilted disc, temporal PPA, Compact   Macula Flat, Good foveal reflex, mild RPE mottling, No heme or edema Flat, Good foveal reflex, mild RPE mottling, No heme or edema   Vessels Normal Normal   Periphery Shallow pocket of SRF superotemporal periphery with demarcation line extending from 1100 to 0800 -- mild interval improvement in SRF ST periphery, persistent SRF from 8-9, pigment deposition on superior portion of demarcation line, lattice with round, atrophic holes at 1030 and 1045 within detachment, pigmented VR tuft at 0730 outside of detachment -- early cryo changes around all lesions Attached, focal pigmented lattice IT quad at 0400, 0430 (with atrophic holes) and 0500        Refraction    Wearing Rx      Sphere Cylinder Axis   Right -8.25 +0.50 055   Left -8.25 +0.50 004       Wearing Rx #2      Sphere Cylinder Axis   Right -7.50 Sphere    Left -7.50 Sphere    Type: CL          IMAGING AND PROCEDURES  Imaging and Procedures for 06/01/2020  OCT, Retina - OU - Both Eyes       Right Eye Quality was good. Central Foveal Thickness: 257. Progression has improved. Findings include normal foveal contour, no IRF, vitreomacular adhesion , subretinal fluid (Mild interval improvement in SRF temporal periphery caught one widefield).   Left Eye Quality was good. Central Foveal Thickness: 256. Progression has been stable. Findings include normal foveal contour, no IRF, no SRF, vitreomacular adhesion .   Notes *Images captured and stored on drive  Diagnosis / Impression:  OD: NFP; no IRF/SRF centrally; Mild interval improvement in SRF temporal periphery caught on widefield OS: NFP, no IRF/SRF  Clinical management:  See below  Abbreviations: NFP - Normal foveal profile. CME -  cystoid macular edema. PED - pigment epithelial detachment. IRF - intraretinal fluid. SRF - subretinal fluid. EZ - ellipsoid zone. ERM - epiretinal membrane. ORA - outer retinal atrophy. ORT - outer retinal tubulation. SRHM - subretinal hyper-reflective material. IRHM - intraretinal hyper-reflective material                 ASSESSMENT/PLAN:    ICD-10-CM  1. Right retinal detachment  H33.21   2. 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 Rhegmatogenous retinal detachment, OD - detached from 11 to 8 oclock, with atrophic holes at 1030 and 1045 - s/p pneumatic retinopexy OD (12.13.21) - IOP 11 - exam and OCT show interval improvement in ST SRF, persistent SRF 8-9 oclock - good early cryo changes - cont Maxitrol ung QID OD - post op drop and positioning instructions reviewed - f/u Monday, December 20, RD OD, DFE, OCT  3,4. Lattice degeneration w/ atrophic holes, both eyes  - OD: lattice with atrophic holes and RD as above - OS: focal pigmented lattice IT at 0400, 0430 and 0500 --0430 with atrophic holes - discussed findings, prognosis, and treatment options including observation - recommend laser retinopexy OS once OD is stable from RD repair - monitor for now    Ophthalmic Meds Ordered this visit:  No orders of the defined types were placed in this encounter.      Return in about 4 days (around 06/05/2020) for f/u RD OD, DFE, OCT.  There are no Patient Instructions on file for this visit.   Explained the diagnoses, plan, and follow up with the patient and they expressed understanding.  Patient expressed understanding of the importance of proper follow up care.   This document serves as a record of services personally performed by Karie Chimera, MD, PhD. It was created on their behalf by Joni Reining, an ophthalmic technician. The creation of this record is  the provider's dictation and/or activities during the visit.    Electronically signed by: Joni Reining COA, 06/01/20  11:38 PM   This document serves as a record of services personally performed by Karie Chimera, MD, PhD. It was created on their behalf by Glee Arvin. Manson Passey, OA an ophthalmic technician. The creation of this record is the provider's dictation and/or activities during the visit.    Electronically signed by: Glee Arvin. Manson Passey, [place] 12.16.2021 11:38 PM  Karie Chimera, M.D., Ph.D. Diseases & Surgery of the Retina and Vitreous [facility]  I have reviewed the above documentation for accuracy and completeness, and I agree with the above. Karie Chimera, M.D., Ph.D. 06/01/20 11:38 PM  Abbreviations: M myopia (nearsighted); A astigmatism; H hyperopia (farsighted); P presbyopia; Mrx spectacle prescription;  CTL contact lenses; OD right eye; OS left eye; OU both eyes  XT exotropia; ET esotropia; PEK punctate epithelial keratitis; PEE punctate epithelial erosions; DES dry eye syndrome; MGD meibomian gland dysfunction; ATs artificial tears; PFAT's preservative free artificial tears; NSC nuclear sclerotic cataract; PSC posterior subcapsular cataract; ERM epi-retinal membrane; PVD posterior vitreous detachment; RD retinal detachment; DM diabetes mellitus; DR diabetic retinopathy; NPDR non-proliferative diabetic retinopathy; PDR proliferative diabetic retinopathy; CSME clinically significant macular edema; DME diabetic macular edema; dbh dot blot hemorrhages; CWS cotton wool spot; POAG primary open angle glaucoma; C/D cup-to-disc ratio; HVF humphrey visual field; GVF goldmann visual field; OCT optical coherence tomography; IOP intraocular pressure; BRVO Branch retinal vein occlusion; CRVO central retinal vein occlusion; CRAO central retinal artery occlusion; BRAO branch retinal artery occlusion; RT retinal tear; SB scleral buckle; PPV pars plana vitrectomy; VH Vitreous  hemorrhage; PRP panretinal laser photocoagulation; IVK intravitreal kenalog; VMT vitreomacular traction; MH Macular hole;  NVD neovascularization of the disc; NVE neovascularization elsewhere; AREDS age related eye  disease study; ARMD age related macular degeneration; POAG primary open angle glaucoma; EBMD epithelial/anterior basement membrane dystrophy; ACIOL anterior chamber intraocular lens; IOL intraocular lens; PCIOL posterior chamber intraocular lens; Phaco/IOL phacoemulsification with intraocular lens placement; Gardner photorefractive keratectomy; LASIK laser assisted in situ keratomileusis; HTN hypertension; DM diabetes mellitus; COPD chronic obstructive pulmonary disease

## 2020-05-30 NOTE — Progress Notes (Signed)
[facility] - Clinic Note  05/30/2020     CHIEF COMPLAINT Patient presents for Retina Follow Up   HISTORY OF PRESENT ILLNESS: Savannah Holland is a 34 y.o. female who presents to the clinic today for:   HPI    Retina Follow Up    Patient presents with  Retinal Break/Detachment.  In right eye.  This started 1 day ago.  I, the attending physician,  performed the HPI with the patient and updated documentation appropriately.          Comments    Patient here for 1 day retina follow up for RD OD. Patient states vision blurry. Able to open al little bit more. Can see. No eye pain today.        Last edited by Rennis Chris, MD on 05/30/2020 12:39 PM. (History)    pt states last night was "rough" she had a hard time sleeping, she states once the numbing wore off she had a headache, tylenol helped with the pain, she states her neck hurts now  Referring physician: Reva Bores, MD [address] [place],  [place] [address]  HISTORICAL INFORMATION:   Selected notes from the MEDICAL RECORD NUMBER Referred by Dr. 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 neomycin-polymyxin b-dexamethasone (MAXITROL) 3.5-10000-0.1 OINT Place 1 application into the right eye 4 (four) times daily.   No current facility-administered medications for this visit. (Ophthalmic Drugs)   Current Outpatient Medications (Other)  Medication Sig  . levonorgestrel (MIRENA) 20 MCG/24HR IUD 1 each by Intrauterine route once.  . Vitamin D, Ergocalciferol, (DRISDOL) 1.25 MG (50000 UNIT) CAPS capsule Take 1 capsule (50,000 Units total) by mouth every 7 (seven) days.   No current facility-administered medications for this visit. (Other)      REVIEW OF SYSTEMS: ROS    Positive for: Eyes   Negative for: Constitutional, Gastrointestinal,  Neurological, Skin, Genitourinary, Musculoskeletal, HENT, Endocrine, Cardiovascular, Respiratory, Psychiatric, Allergic/Imm, Heme/Lymph   Last edited by Laddie Aquas, COA on 05/30/2020  9:55 AM. (History)       ALLERGIES No Known Allergies  PAST MEDICAL HISTORY Past Medical History:  Diagnosis Date  . Allergy   . GERD (gastroesophageal reflux disease)    History reviewed. No pertinent surgical history.  FAMILY HISTORY Family History  Problem Relation Age of Onset  . Fibroids Mother   . Hyperlipidemia Father   . GER disease Father   . Prostate cancer Father   . Diabetes Maternal Grandmother   . Diabetes Paternal Grandmother   . Breast cancer Maternal Aunt     SOCIAL HISTORY Social History   Tobacco Use  . Smoking status: Never Smoker  . Smokeless tobacco: Never Used  Vaping Use  . Vaping Use: Never used  Substance Use Topics  . Alcohol use: Yes    Comment: rare  . Drug use: No         OPHTHALMIC EXAM:  Base Eye Exam    Visual Acuity (Snellen - Linear)      Right Left   Dist cc 20/50 20/20   Dist ph cc 20/40 -1    Correction: Glasses       Tonometry (Tonopen, 9:52 AM)      Right Left   Pressure 14 17       Extraocular Movement  Right Left    Full Full       Neuro/Psych    Oriented x3: Yes   Mood/Affect: Normal       Dilation    Right eye: 1.0% Mydriacyl, 2.5% Phenylephrine @ 9:52 AM        Slit Lamp and Fundus Exam    Slit Lamp Exam      Right Left   Lids/Lashes Normal Normal   Conjunctiva/Sclera Subconjunctival hemorrhage, Chemosis White and quiet   Cornea Clear Clear   Anterior Chamber Deep and quiet Deep and quiet   Iris Round and dilated Round and dilated   Lens Clear Clear   Vitreous Vitreous syneresis, no pigment, multiple gas bubbles approx 25-30% Vitreous syneresis, no pigment       Fundus Exam      Right Left   Disc Pink and Sharp, Tilted disc, temporal PPA Pink and Sharp, mildly Tilted disc, temporal PPA,  Compact   Macula Flat, Good foveal reflex, mild RPE mottling, No heme or edema Flat, Good foveal reflex, mild RPE mottling, No heme or edema   Vessels Normal Normal   Periphery Shallow pocket of SRF superotemporal periphery with demarcation line extending from 1100 to 0800 -- mild interval improvement in SRF, pigment deposition on superior portion of demarcation line, lattice with round, atrophic holes at 1030 and 1045 within detachment, pigmented VR tuft at 0730 outside of detachment -- early cryo around all lesions Attached, focal pigmented lattice IT quad at 0400, 0430 (with atrophic holes) and 0500        Refraction    Wearing Rx      Sphere Cylinder Axis   Right -8.25 +0.50 055   Left -8.25 +0.50 004       Wearing Rx #2      Sphere Cylinder Axis   Right -7.50 Sphere    Left -7.50 Sphere    Type: CL          IMAGING AND PROCEDURES  Imaging and Procedures for 05/30/2020  OCT, Retina - OU - Both Eyes       Right Eye Quality was good. Central Foveal Thickness: 257. Progression has improved. Findings include normal foveal contour, no IRF, vitreomacular adhesion , subretinal fluid (Mild interval improvement in SRF temporal periphery caught one widefield).   Left Eye Quality was good. Central Foveal Thickness: 256. Progression has been stable. Findings include normal foveal contour, no IRF, no SRF, vitreomacular adhesion .   Notes *Images captured and stored on drive  Diagnosis / Impression:  OD: NFP; no IRF/SRF centrally; Mild interval improvement in SRF temporal periphery caught on widefield OS: NFP, no IRF/SRF  Clinical management:  See below  Abbreviations: NFP - Normal foveal profile. CME - cystoid macular edema. PED - pigment epithelial detachment. IRF - intraretinal fluid. SRF - subretinal fluid. EZ - ellipsoid zone. ERM - epiretinal membrane. ORA - outer retinal atrophy. ORT - outer retinal tubulation. SRHM - subretinal hyper-reflective material. IRHM -  intraretinal hyper-reflective material                 ASSESSMENT/PLAN:    ICD-10-CM   1. Right retinal detachment  H33.21   2. 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 Rhegmatogenous retinal detachment, OD - detached from 11 to 8 oclock, with atrophic holes at 1030 and 1045 - s/p  pneumatic retinopexy OD (12.13.21) - did okay overnight - IOP 14 - exam and OCT shows mild interval improvement in SRF - cont Maxitrol ung QID OD - post op drop and positioning instructions reviewed - f/u 2 days, RD OD, DFE, OCT  3,4. Lattice degeneration w/ atrophic holes, both eyes  - OD: lattice with atrophic holes and RD as above - OS: focal pigmented lattice IT at 0400, 0430 and 0500 --0430 with atrophic holes - discussed findings, prognosis, and treatment options including observation - recommend laser retinopexy OS once OD is stable from RD repair - monitor for now    Ophthalmic Meds Ordered this visit:  No orders of the defined types were placed in this encounter.      Return in about 2 days (around 06/01/2020) for f/u RD OD, DFE, OCT.  There are no Patient Instructions on file for this visit.   Explained the diagnoses, plan, and follow up with the patient and they expressed understanding.  Patient expressed understanding of the importance of proper follow up care.   This document serves as a record of services personally performed by Karie Chimera, MD, PhD. It was created on their behalf by Glee Arvin. Manson Passey, OA an ophthalmic technician. The creation of this record is the provider's dictation and/or activities during the visit.    Electronically signed by: Glee Arvin. Manson Passey, [place] 12.14.2021 12:44 PM   Karie Chimera, M.D., Ph.D. Diseases & Surgery of the Retina and Vitreous [facility]  I have reviewed the above documentation for accuracy and  completeness, and I agree with the above. Karie Chimera, M.D., Ph.D. 05/30/20 12:44 PM    Abbreviations: M myopia (nearsighted); A astigmatism; H hyperopia (farsighted); P presbyopia; Mrx spectacle prescription;  CTL contact lenses; OD right eye; OS left eye; OU both eyes  XT exotropia; ET esotropia; PEK punctate epithelial keratitis; PEE punctate epithelial erosions; DES dry eye syndrome; MGD meibomian gland dysfunction; ATs artificial tears; PFAT's preservative free artificial tears; NSC nuclear sclerotic cataract; PSC posterior subcapsular cataract; ERM epi-retinal membrane; PVD posterior vitreous detachment; RD retinal detachment; DM diabetes mellitus; DR diabetic retinopathy; NPDR non-proliferative diabetic retinopathy; PDR proliferative diabetic retinopathy; CSME clinically significant macular edema; DME diabetic macular edema; dbh dot blot hemorrhages; CWS cotton wool spot; POAG primary open angle glaucoma; C/D cup-to-disc ratio; HVF humphrey visual field; GVF goldmann visual field; OCT optical coherence tomography; IOP intraocular pressure; BRVO Branch retinal vein occlusion; CRVO central retinal vein occlusion; CRAO central retinal artery occlusion; BRAO branch retinal artery occlusion; RT retinal tear; SB scleral buckle; PPV pars plana vitrectomy; VH Vitreous hemorrhage; PRP panretinal laser photocoagulation; IVK intravitreal kenalog; VMT vitreomacular traction; MH Macular hole;  NVD neovascularization of the disc; NVE neovascularization elsewhere; AREDS age related eye disease study; ARMD age related macular degeneration; POAG primary open angle glaucoma; EBMD epithelial/anterior basement membrane dystrophy; ACIOL anterior chamber intraocular lens; IOL intraocular lens; PCIOL posterior chamber intraocular lens; Phaco/IOL phacoemulsification with intraocular lens placement; PRK photorefractive keratectomy; LASIK laser assisted in situ keratomileusis; HTN hypertension; DM diabetes mellitus; COPD  chronic obstructive pulmonary disease

## 2020-06-01 ENCOUNTER — Other Ambulatory Visit: Payer: Self-pay

## 2020-06-01 ENCOUNTER — Encounter (INDEPENDENT_AMBULATORY_CARE_PROVIDER_SITE_OTHER): Payer: Self-pay | Admitting: Ophthalmology

## 2020-06-01 ENCOUNTER — Ambulatory Visit (INDEPENDENT_AMBULATORY_CARE_PROVIDER_SITE_OTHER): Payer: No Typology Code available for payment source | Admitting: Ophthalmology

## 2020-06-01 DIAGNOSIS — H3581 Retinal edema: Secondary | ICD-10-CM | POA: Diagnosis not present

## 2020-06-01 DIAGNOSIS — H3321 Serous retinal detachment, right eye: Secondary | ICD-10-CM

## 2020-06-01 DIAGNOSIS — H35413 Lattice degeneration of retina, bilateral: Secondary | ICD-10-CM

## 2020-06-01 DIAGNOSIS — H33323 Round hole, bilateral: Secondary | ICD-10-CM

## 2020-06-02 ENCOUNTER — Encounter (INDEPENDENT_AMBULATORY_CARE_PROVIDER_SITE_OTHER): Payer: No Typology Code available for payment source | Admitting: Ophthalmology

## 2020-06-05 ENCOUNTER — Ambulatory Visit (INDEPENDENT_AMBULATORY_CARE_PROVIDER_SITE_OTHER): Payer: No Typology Code available for payment source | Admitting: Ophthalmology

## 2020-06-05 ENCOUNTER — Encounter: Payer: Self-pay | Admitting: Radiology

## 2020-06-05 ENCOUNTER — Other Ambulatory Visit (INDEPENDENT_AMBULATORY_CARE_PROVIDER_SITE_OTHER): Payer: Self-pay | Admitting: Ophthalmology

## 2020-06-05 ENCOUNTER — Encounter (INDEPENDENT_AMBULATORY_CARE_PROVIDER_SITE_OTHER): Payer: Self-pay | Admitting: Ophthalmology

## 2020-06-05 ENCOUNTER — Other Ambulatory Visit: Payer: Self-pay

## 2020-06-05 DIAGNOSIS — H33323 Round hole, bilateral: Secondary | ICD-10-CM

## 2020-06-05 DIAGNOSIS — H35413 Lattice degeneration of retina, bilateral: Secondary | ICD-10-CM

## 2020-06-05 DIAGNOSIS — H3321 Serous retinal detachment, right eye: Secondary | ICD-10-CM

## 2020-06-05 DIAGNOSIS — H3581 Retinal edema: Secondary | ICD-10-CM

## 2020-06-05 MED ORDER — MAXITROL 3.5-10000-0.1 OP OINT: 1.0000 "application " | TOPICAL_OINTMENT | Freq: Four times a day (QID) | OPHTHALMIC | 0 refills | Status: DC

## 2020-06-05 MED ORDER — PREDNISOLONE ACETATE 1 % OP SUSP: 1.0000 [drp] | Freq: Four times a day (QID) | OPHTHALMIC | 0 refills | Status: DC

## 2020-06-05 MED FILL — PREDNISOLONE AC 1% EYE DROP: 1 | 25 days supply | Qty: 5 | Fill #0

## 2020-06-05 MED FILL — NEO/POLY/DEXAMET EYE OINT: 3.5-10000-0 | 3 days supply | Qty: 4 | Fill #0

## 2020-06-05 NOTE — Progress Notes (Signed)
[facility] - Clinic Note  06/05/2020     CHIEF COMPLAINT Patient presents for Retina Follow Up   HISTORY OF PRESENT ILLNESS: Savannah Holland is a 34 y.o. female who presents to the clinic today for:   HPI    Retina Follow Up    Patient presents with  Retinal Break/Detachment.  In right eye.  This started 4 days ago.  I, the attending physician,  performed the HPI with the patient and updated documentation appropriately.          Comments    Patient here for 4 days retina follow up for RD OD. Patient states OD is getting a little bit blurrier. The bubble is line of vision. Before was bunch of bubbles. Now all one bubble and smaller. In past had a tension headache but that is gone away. Feels a like a little bit of tingling that doesn't last long.        Last edited by Rennis Chris, MD on 06/05/2020  1:02 PM. (History)    pt states gas bubble is only 1 bubble now, she is positioning as directed  Referring physician: Reva Bores, MD [address] [place],  [place] [address]  HISTORICAL INFORMATION:   Selected notes from the MEDICAL RECORD NUMBER Referred by Dr. 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 neomycin-polymyxin b-dexamethasone (MAXITROL) 3.5-10000-0.1 OINT Place 1 application into the right eye 4 (four) times daily.  Marland Kitchen neomycin-polymyxin-dexameth (MAXITROL) 0.1 % OINT Place 1 application into the right eye 4 (four) times daily.  . prednisoLONE acetate (PRED FORTE) 1 % ophthalmic suspension Place 1 drop into the right eye 4 (four) times daily for 7 days.   No current facility-administered medications for this visit. (Ophthalmic Drugs)   Current Outpatient Medications (Other)  Medication Sig  . levonorgestrel (MIRENA) 20 MCG/24HR IUD 1 each by Intrauterine route once.  . Vitamin D,  Ergocalciferol, (DRISDOL) 1.25 MG (50000 UNIT) CAPS capsule Take 1 capsule (50,000 Units total) by mouth every 7 (seven) days.   No current facility-administered medications for this visit. (Other)      REVIEW OF SYSTEMS: ROS    Positive for: Eyes   Negative for: Constitutional, Gastrointestinal, Neurological, Skin, Genitourinary, Musculoskeletal, HENT, Endocrine, Cardiovascular, Respiratory, Psychiatric, Allergic/Imm, Heme/Lymph   Last edited by Laddie Aquas, COA on 06/05/2020  8:56 AM. (History)       ALLERGIES No Known Allergies  PAST MEDICAL HISTORY Past Medical History:  Diagnosis Date  . Allergy   . GERD (gastroesophageal reflux disease)    History reviewed. No pertinent surgical history.  FAMILY HISTORY Family History  Problem Relation Age of Onset  . Fibroids Mother   . Hyperlipidemia Father   . GER disease Father   . Prostate cancer Father   . Diabetes Maternal Grandmother   . Diabetes Paternal Grandmother   . Breast cancer Maternal Aunt     SOCIAL HISTORY Social History   Tobacco Use  . Smoking status: Never Smoker  . Smokeless tobacco: Never Used  Vaping Use  . Vaping Use: Never used  Substance Use Topics  . Alcohol use: Yes    Comment: rare  . Drug use: No         OPHTHALMIC EXAM:  Base Eye Exam    Visual Acuity (Snellen - Linear)  Right Left   Dist cc 20/40 20/25 -2   Dist ph cc 20/25 20/25 +2       Tonometry (Tonopen, 8:50 AM)      Right Left   Pressure 16 16       Pupils      Dark Light Shape React APD   Right 5 4 Round Brisk None   Left 5 4 Round Brisk None       Visual Fields (Counting fingers)      Left Right    Full Full       Extraocular Movement      Right Left    Full Full       Neuro/Psych    Oriented x3: Yes   Mood/Affect: Normal       Dilation    Right eye: 1.0% Mydriacyl, 2.5% Phenylephrine @ 8:50 AM        Slit Lamp and Fundus Exam    Slit Lamp Exam      Right Left   Lids/Lashes  Normal Normal   Conjunctiva/Sclera Subconjunctival hemorrhage, Chemosis -- both improving White and quiet   Cornea 1+ Punctate epithelial erosions Clear   Anterior Chamber Deep and quiet Deep and quiet   Iris Round and dilated Round and dilated   Lens Clear Clear   Vitreous Vitreous syneresis, no pigment, gas bubble 35% -- single bubble Vitreous syneresis, no pigment       Fundus Exam      Right Left   Disc Pink and Sharp, Tilted disc, temporal PPA Pink and Sharp, mildly Tilted disc, temporal PPA, Compact   Macula Flat, Good foveal reflex, mild RPE mottling, No heme or edema Flat, Good foveal reflex, mild RPE mottling, No heme or edema   Vessels Normal Normal   Periphery Shallow pocket of SRF superotemporal periphery with demarcation line extending from 1100 to 0800 -- mild interval improvement in SRF ST periphery, pooling of residual SRF around 0800, pigment deposition on superior portion of demarcation line, lattice with round, atrophic holes at 1030 and 1045 within detachment, pigmented VR tuft at 0730 outside of detachment -- early cryo changes around all lesions Attached, focal pigmented lattice IT quad at 0400, 0430 (with atrophic holes) and 0500        Refraction    Wearing Rx      Sphere Cylinder Axis   Right -8.25 +0.50 055   Left -8.25 +0.50 004       Wearing Rx #2      Sphere Cylinder Axis   Right -7.50 Sphere    Left -7.50 Sphere    Type: CL          IMAGING AND PROCEDURES  Imaging and Procedures for 06/05/2020  OCT, Retina - OU - Both Eyes       Right Eye Quality was good. Central Foveal Thickness: 259. Progression has been stable. Findings include normal foveal contour, no IRF, vitreomacular adhesion , subretinal fluid (Persistent SRF temporal periphery caught on widefield).   Left Eye Quality was good. Central Foveal Thickness: 251. Progression has been stable. Findings include normal foveal contour, no IRF, no SRF, vitreomacular adhesion .    Notes *Images captured and stored on drive  Diagnosis / Impression:  OD: NFP; no IRF/SRF centrally; Persistent SRF temporal periphery caught on widefield OS: NFP, no IRF/SRF  Clinical management:  See below  Abbreviations: NFP - Normal foveal profile. CME - cystoid macular edema. PED - pigment epithelial detachment. IRF - intraretinal fluid.  SRF - subretinal fluid. EZ - ellipsoid zone. ERM - epiretinal membrane. ORA - outer retinal atrophy. ORT - outer retinal tubulation. SRHM - subretinal hyper-reflective material. IRHM - intraretinal hyper-reflective material         LASER PROCEDURE NOTE  Procedure:  Barrier laser retinopexy using slit lamp laser, RIGHT eye   Diagnosis:   Retinal detachment, RIGHT eye  Surgeon: Rennis Chris, MD, PhD  Anesthesia: Topical  Informed consent obtained, operative eye marked, and time out performed prior to initiation of laser.   Laser settings:  Lumenis [id number] laser, slit lamp Lens: Mainster PRP 165 Power: 240 mW Spot size: 200 microns Duration: 30 msec  # spots: 454  Placement of laser: Using a Mainster PRP 165 contact lens at the slit lamp, laser was placed in three confluent rows along and just posterior to the temporal demarcation line to further barricade the persistent SRF.  Complications: None.  Patient tolerated the procedure well and received written and verbal post-procedure care information/education.          ASSESSMENT/PLAN:    ICD-10-CM   1. Right retinal detachment  H33.21   2. 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 Rhegmatogenous retinal detachment, OD - detached from 11 to 8 oclock, with atrophic holes at 1030 and 1045 - s/p pneumatic retinopexy OD (12.13.21) - IOP 11 - exam and OCT show mild interval improvement in ST SRF, persistent SRF 8-9 oclock - good early cryo changes - recommend  barricade laser retinopexy today, 12.20.21 - pt wishes to proceed  - RBA of procedure discussed, questions answered  - informed consent obtained and signed - see procedure note - start PF QID OD - cont Maxitrol ung QID OD - post op drop and positioning instructions reviewed - f/u Wednesday, December 22, RD OD, DFE, OCT  3,4. Lattice degeneration w/ atrophic holes, both eyes  - OD: lattice with atrophic holes and RD as above - OS: focal pigmented lattice IT at 0400, 0430 and 0500 --0430 with atrophic holes - discussed findings, prognosis, and treatment options including observation - recommend laser retinopexy OS once OD is stable from RD repair - monitor for now  Ophthalmic Meds Ordered this visit:  Meds ordered this encounter  Medications  . prednisoLONE acetate (PRED FORTE) 1 % ophthalmic suspension    Sig: Place 1 drop into the right eye 4 (four) times daily for 7 days.    Dispense:  10 mL    Refill:  0  . neomycin-polymyxin-dexameth (MAXITROL) 0.1 % OINT    Sig: Place 1 application into the right eye 4 (four) times daily.    Dispense:  1 g    Refill:  0       Return in about 2 days (around 06/07/2020) for f/u RD OD, DFE, OCT.  There are no Patient Instructions on file for this visit.   Explained the diagnoses, plan, and follow up with the patient and they expressed understanding.  Patient expressed understanding of the importance of proper follow up care.   This document serves as a record of services personally performed by Karie Chimera, MD, PhD. It was created on their behalf by Glee Arvin. Manson Passey, OA an ophthalmic technician. The creation of this record is the provider's dictation and/or activities during the visit.    Electronically signed by: Glee Arvin. Manson Passey, OA  12.20.2021 1:06 PM  Karie ChimeraBrian G. Vanita Cannell, M.D., Ph.D. Diseases & Surgery of the Retina and Vitreous [facility]  I have reviewed the above documentation for accuracy and completeness,  and I agree with the above. Karie ChimeraBrian G. Bowe Sidor, M.D., Ph.D. 06/05/20 1:06 PM    Abbreviations: M myopia (nearsighted); A astigmatism; H hyperopia (farsighted); P presbyopia; Mrx spectacle prescription;  CTL contact lenses; OD right eye; OS left eye; OU both eyes  XT exotropia; ET esotropia; PEK punctate epithelial keratitis; PEE punctate epithelial erosions; DES dry eye syndrome; MGD meibomian gland dysfunction; ATs artificial tears; PFAT's preservative free artificial tears; NSC nuclear sclerotic cataract; PSC posterior subcapsular cataract; ERM epi-retinal membrane; PVD posterior vitreous detachment; RD retinal detachment; DM diabetes mellitus; DR diabetic retinopathy; NPDR non-proliferative diabetic retinopathy; PDR proliferative diabetic retinopathy; CSME clinically significant macular edema; DME diabetic macular edema; dbh dot blot hemorrhages; CWS cotton wool spot; POAG primary open angle glaucoma; C/D cup-to-disc ratio; HVF humphrey visual field; GVF goldmann visual field; OCT optical coherence tomography; IOP intraocular pressure; BRVO Branch retinal vein occlusion; CRVO central retinal vein occlusion; CRAO central retinal artery occlusion; BRAO branch retinal artery occlusion; RT retinal tear; SB scleral buckle; PPV pars plana vitrectomy; VH Vitreous hemorrhage; PRP panretinal laser photocoagulation; IVK intravitreal kenalog; VMT vitreomacular traction; MH Macular hole;  NVD neovascularization of the disc; NVE neovascularization elsewhere; AREDS age related eye disease study; ARMD age related macular degeneration; POAG primary open angle glaucoma; EBMD epithelial/anterior basement membrane dystrophy; ACIOL anterior chamber intraocular lens; IOL intraocular lens; PCIOL posterior chamber intraocular lens; Phaco/IOL phacoemulsification with intraocular lens placement; PRK photorefractive keratectomy; LASIK laser assisted in situ keratomileusis; HTN hypertension; DM diabetes mellitus; COPD chronic obstructive  pulmonary disease

## 2020-06-06 NOTE — Progress Notes (Signed)
[facility] - Clinic Note  06/07/2020     CHIEF COMPLAINT Patient presents for Post-op Follow-up   HISTORY OF PRESENT ILLNESS: Savannah Holland is a 34 y.o. female who presents to the clinic today for:   HPI    Post-op Follow-up    In right eye.  I, the attending physician,  performed the HPI with the patient and updated documentation appropriately.          Comments    9 day post op Pneumatic Retinopexy for RD OD- Doing well.  Vision is about the same.  Prednisolone QID OD and Maxitrol ung QID OD (used at 6:30 this morning).         Last edited by Rennis Chris, MD on 06/07/2020 12:38 PM. (History)      Referring physician: Reva Bores, MD [address] [place],  [place] [address]  HISTORICAL INFORMATION:   Selected notes from the MEDICAL RECORD NUMBER Referred by Dr. 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 neomycin-polymyxin b-dexamethasone (MAXITROL) 3.5-10000-0.1 OINT Place 1 application into the right eye 4 (four) times daily.  Marland Kitchen neomycin-polymyxin-dexameth (MAXITROL) 0.1 % OINT Place 1 application into the right eye 4 (four) times daily.  . prednisoLONE acetate (PRED FORTE) 1 % ophthalmic suspension Place 1 drop into the right eye 4 (four) times daily for 7 days.   No current facility-administered medications for this visit. (Ophthalmic Drugs)   Current Outpatient Medications (Other)  Medication Sig  . levonorgestrel (MIRENA) 20 MCG/24HR IUD 1 each by Intrauterine route once.  . Vitamin D, Ergocalciferol, (DRISDOL) 1.25 MG (50000 UNIT) CAPS capsule Take 1 capsule (50,000 Units total) by mouth every 7 (seven) days. (Patient not taking: Reported on 06/07/2020)   No current facility-administered medications for this visit. (Other)      REVIEW OF SYSTEMS: ROS    Positive for: Eyes, Psychiatric   Negative for:  Constitutional, Gastrointestinal, Neurological, Skin, Genitourinary, Musculoskeletal, HENT, Endocrine, Cardiovascular, Respiratory, Allergic/Imm, Heme/Lymph   Last edited by Joni Reining, COA on 06/07/2020 10:01 AM. (History)       ALLERGIES No Known Allergies  PAST MEDICAL HISTORY Past Medical History:  Diagnosis Date  . Allergy   . GERD (gastroesophageal reflux disease)    History reviewed. No pertinent surgical history.  FAMILY HISTORY Family History  Problem Relation Age of Onset  . Fibroids Mother   . Hyperlipidemia Father   . GER disease Father   . Prostate cancer Father   . Diabetes Maternal Grandmother   . Diabetes Paternal Grandmother   . Breast cancer Maternal Aunt     SOCIAL HISTORY Social History   Tobacco Use  . Smoking status: Never Smoker  . Smokeless tobacco: Never Used  Vaping Use  . Vaping Use: Never used  Substance Use Topics  . Alcohol use: Yes    Comment: rare  . Drug use: No         OPHTHALMIC EXAM:  Base Eye Exam    Visual Acuity (Snellen - Linear)      Right Left   Dist cc 20/40 +2 20/25   Dist ph cc 20/25 20/20 -1   Correction: Glasses       Tonometry (Tonopen, 10:07 AM)      Right Left   Pressure 13 17       Pupils      Dark Light Shape  React APD   Right 5 4 Round Brisk None   Left 5 4 Round Brisk None       Visual Fields (Counting fingers)      Left Right    Full Full       Extraocular Movement      Right Left    Full Full       Neuro/Psych    Oriented x3: Yes   Mood/Affect: Normal       Dilation    Right eye: 1.0% Mydriacyl, 2.5% Phenylephrine @ 10:07 AM        Slit Lamp and Fundus Exam    Slit Lamp Exam      Right Left   Lids/Lashes Normal Normal   Conjunctiva/Sclera Subconjunctival hemorrhage, Chemosis -- both improving White and quiet   Cornea 1+ Punctate epithelial erosions Clear   Anterior Chamber Deep and quiet Deep and quiet   Iris Round and dilated Round and dilated   Lens Clear Clear    Vitreous Vitreous syneresis, no pigment, gas bubble 35% -- single bubble Vitreous syneresis, no pigment       Fundus Exam      Right Left   Disc Pink and Sharp, Tilted disc, temporal PPA Pink and Sharp, mildly Tilted disc, temporal PPA, Compact   Macula Flat, Good foveal reflex, mild RPE mottling, No heme or edema Flat, Good foveal reflex, mild RPE mottling, No heme or edema   Vessels Normal Normal   Periphery Shallow pocket of SRF superotemporal periphery with demarcation line extending from 1100 to 0800 -- now with early laser barricade changes; mild interval improvement in residual SRF around 0800,  lattice with round, atrophic holes at 1030 and 1045 within detachment, pigmented VR tuft at 0730 outside of detachment -- evolving cryo changes around all lesions Attached, focal pigmented lattice IT quad at 0400, 0430 (with atrophic holes) and 0500        Refraction    Wearing Rx      Sphere Cylinder Axis   Right -8.25 +0.50 055   Left -8.25 +0.50 004       Wearing Rx #2      Sphere Cylinder Axis   Right -7.50 Sphere    Left -7.50 Sphere    Type: CL          IMAGING AND PROCEDURES  Imaging and Procedures for 06/07/2020  OCT, Retina - OU - Both Eyes       Right Eye Quality was good. Central Foveal Thickness: 267. Progression has improved. Findings include normal foveal contour, no IRF, vitreomacular adhesion , subretinal fluid (Persistent SRF temporal periphery caught on widefield--slightly improved).   Left Eye Quality was good. Central Foveal Thickness: 251. Progression has been stable. Findings include normal foveal contour, no IRF, no SRF, vitreomacular adhesion .   Notes *Images captured and stored on drive  Diagnosis / Impression:  OD: NFP; no IRF/SRF centrally; Persistent SRF temporal periphery caught on widefield--slightly improved OS: NFP, no IRF/SRF  Clinical management:  See below  Abbreviations: NFP - Normal foveal profile. CME - cystoid macular edema.  PED - pigment epithelial detachment. IRF - intraretinal fluid. SRF - subretinal fluid. EZ - ellipsoid zone. ERM - epiretinal membrane. ORA - outer retinal atrophy. ORT - outer retinal tubulation. SRHM - subretinal hyper-reflective material. IRHM - intraretinal hyper-reflective material         LASER PROCEDURE NOTE  Procedure:  Barrier laser retinopexy using laser indirect ophthalmoscopy, RIGHT eye  -- fill in  Diagnosis:   Retinal detachment, RIGHT eye  Surgeon: Rennis Chris, MD, PhD  Anesthesia: Topical  Informed consent obtained, operative eye marked, and time out performed prior to initiation of laser.   Laser settings:  Lumenis [id number] laser indirect ophthalmoscope Power: 250 mW Duration: 70 msec  # spots: 297  Placement of laser: Using a Mainster PRP 165 contact lens at the slit lamp, laser was placed in three confluent rows along and just posterior to the superior portion of temporal demarcation line to further barricade the persistent SRF.  Complications: None.  Patient tolerated the procedure well and received written and verbal post-procedure care information/education.         ASSESSMENT/PLAN:    ICD-10-CM   1. Right retinal detachment  H33.21   2. 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 Rhegmatogenous retinal detachment, OD - detached from 11 to 8 oclock, with atrophic holes at 1030 and 1045 - s/p pneumatic retinopexy OD (12.13.21) - s/p supplemental barricade laser retinopexy OD 12.20.21 - IOP 13 - exam and OCT show mild interval improvement in ST SRF, persistent SRF 8-9 oclock - cryo changes evolving - recommend touch-up barricade laser retinopexy today, 12.22.21 w/ indirect ophthalmoscope - pt wishes to proceed  - RBA of procedure discussed, questions answered  - informed consent obtained and signed - see procedure note - start PF QID  OD - cont Maxitrol ung QID OD - post op drop and positioning instructions reviewed - f/u Wednesday, 06/14/20 -- POV, DFE/OCT  3,4. Lattice degeneration w/ atrophic holes, both eyes  - OD: lattice with atrophic holes and RD as above - OS: focal pigmented lattice IT at 0400, 0430 and 0500 --0430 with atrophic holes - discussed findings, prognosis, and treatment options including observation - recommend laser retinopexy OS once OD is stable from RD repair - monitor for now  Ophthalmic Meds Ordered this visit:  No orders of the defined types were placed in this encounter.     Return for F/u Wednesday, 06/14/20 for POV s/p touch-up laser OD.  There are no Patient Instructions on file for this visit.  Explained the diagnoses, plan, and follow up with the patient and they expressed understanding.  Patient expressed understanding of the importance of proper follow up care.   This document serves as a record of services personally performed by Karie Chimera, MD, PhD. It was created on their behalf by Annalee Genta, COMT. The creation of this record is the provider's dictation and/or activities during the visit.  Electronically signed by: Annalee Genta, COMT 06/07/20 12:40 PM  This document serves as a record of services personally performed by Karie Chimera, MD, PhD. It was created on their behalf by Cristopher Estimable, COT an ophthalmic technician. The creation of this record is the provider's dictation and/or activities during the visit.    Electronically signed by: Cristopher Estimable, COT 12.22.21 @ 12:40 PM  Karie Chimera, M.D., Ph.D. Diseases & Surgery of the Retina and Vitreous [facility] 12.22.21  I have reviewed the above documentation for accuracy and completeness, and I agree with the above. Karie Chimera, M.D., Ph.D. 06/07/20 12:46 PM   Abbreviations: M myopia (nearsighted); A astigmatism; H hyperopia (farsighted); P presbyopia; Mrx spectacle  prescription;  CTL contact lenses; OD right eye; OS left eye; OU both eyes  XT exotropia; ET esotropia; PEK punctate epithelial keratitis; PEE punctate epithelial erosions; DES dry eye syndrome; MGD meibomian gland dysfunction; ATs artificial tears; PFAT's preservative free artificial tears; [place] nuclear sclerotic cataract; PSC posterior subcapsular cataract; ERM epi-retinal membrane; PVD posterior vitreous detachment; RD retinal detachment; DM diabetes mellitus; DR diabetic retinopathy; NPDR non-proliferative diabetic retinopathy; PDR proliferative diabetic retinopathy; CSME clinically significant macular edema; DME diabetic macular edema; dbh dot blot hemorrhages; CWS cotton wool spot; POAG primary open angle glaucoma; C/D cup-to-disc ratio; HVF humphrey visual field; GVF goldmann visual field; OCT optical coherence tomography; IOP intraocular pressure; BRVO Branch retinal vein occlusion; CRVO central retinal vein occlusion; CRAO central retinal artery occlusion; BRAO branch retinal artery occlusion; RT retinal tear; SB scleral buckle; PPV pars plana vitrectomy; VH Vitreous hemorrhage; PRP panretinal laser photocoagulation; IVK intravitreal kenalog; VMT vitreomacular traction; MH Macular hole;  NVD neovascularization of the disc; NVE neovascularization elsewhere; AREDS age related eye disease study; ARMD age related macular degeneration; POAG primary open angle glaucoma; EBMD epithelial/anterior basement membrane dystrophy; ACIOL anterior chamber intraocular lens; IOL intraocular lens; PCIOL posterior chamber intraocular lens; Phaco/IOL phacoemulsification with intraocular lens placement; [place] photorefractive keratectomy; LASIK laser assisted in situ keratomileusis; HTN hypertension; DM diabetes mellitus; COPD chronic obstructive pulmonary disease

## 2020-06-07 ENCOUNTER — Ambulatory Visit (INDEPENDENT_AMBULATORY_CARE_PROVIDER_SITE_OTHER): Payer: No Typology Code available for payment source | Admitting: Ophthalmology

## 2020-06-07 ENCOUNTER — Other Ambulatory Visit: Payer: Self-pay

## 2020-06-07 ENCOUNTER — Encounter (INDEPENDENT_AMBULATORY_CARE_PROVIDER_SITE_OTHER): Payer: Self-pay | Admitting: Ophthalmology

## 2020-06-07 DIAGNOSIS — H33323 Round hole, bilateral: Secondary | ICD-10-CM

## 2020-06-07 DIAGNOSIS — H3581 Retinal edema: Secondary | ICD-10-CM | POA: Diagnosis not present

## 2020-06-07 DIAGNOSIS — H35413 Lattice degeneration of retina, bilateral: Secondary | ICD-10-CM

## 2020-06-07 DIAGNOSIS — H3321 Serous retinal detachment, right eye: Secondary | ICD-10-CM

## 2020-06-14 ENCOUNTER — Other Ambulatory Visit: Payer: Self-pay

## 2020-06-14 ENCOUNTER — Ambulatory Visit (INDEPENDENT_AMBULATORY_CARE_PROVIDER_SITE_OTHER): Payer: No Typology Code available for payment source | Admitting: Ophthalmology

## 2020-06-14 ENCOUNTER — Encounter (INDEPENDENT_AMBULATORY_CARE_PROVIDER_SITE_OTHER): Payer: Self-pay | Admitting: Ophthalmology

## 2020-06-14 DIAGNOSIS — H35413 Lattice degeneration of retina, bilateral: Secondary | ICD-10-CM

## 2020-06-14 DIAGNOSIS — H33323 Round hole, bilateral: Secondary | ICD-10-CM | POA: Diagnosis not present

## 2020-06-14 DIAGNOSIS — H3321 Serous retinal detachment, right eye: Secondary | ICD-10-CM

## 2020-06-14 DIAGNOSIS — H3581 Retinal edema: Secondary | ICD-10-CM

## 2020-06-14 NOTE — Progress Notes (Signed)
[facility] - Clinic Note  06/14/2020     CHIEF COMPLAINT Patient presents for Post-op Follow-up (Patient states saw some black spots earlier this am when she was outside. No flashes. No floaters or flashes OS. Vision seem about the same OU. Using Pred Forte and maxitrol ung qid OD.)   HISTORY OF PRESENT ILLNESS: Savannah Holland is a 34 y.o. female who presents to the clinic today for:   HPI    Post-op Follow-up    In right eye.  Discomfort includes Negative for pain, itching, foreign body sensation, tearing, discharge, floaters and none.  Vision is stable.  I, the attending physician,  performed the HPI with the patient and updated documentation appropriately. Additional comments: Patient states saw some black spots earlier this am when she was outside. No flashes. No floaters or flashes OS. Vision seem about the same OU. Using Pred Forte and maxitrol ung qid OD.       Last edited by Rennis Chris, MD on 06/14/2020  5:09 PM. (History)    pt states she was able to maintain positioning over Christmas  Referring physician: Reva Bores, MD [address] Floor [place],  [place] [address]  HISTORICAL INFORMATION:   Selected notes from the MEDICAL RECORD NUMBER Referred by Dr. 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 neomycin-polymyxin b-dexamethasone (MAXITROL) 3.5-10000-0.1 OINT Place 1 application into the right eye 4 (four) times daily.  Marland Kitchen neomycin-polymyxin-dexameth (MAXITROL) 0.1 % OINT Place 1 application into the right eye 4 (four) times daily. (Patient not taking: Reported on 06/14/2020)  . prednisoLONE acetate (PRED FORTE) 1 % ophthalmic suspension Place 1 drop into the right eye 4 (four) times daily.   No current facility-administered medications for this visit. (Ophthalmic Drugs)   Current Outpatient Medications (Other)  Medication  Sig  . levonorgestrel (MIRENA) 20 MCG/24HR IUD 1 each by Intrauterine route once.  Marland Kitchen acetaminophen (TYLENOL) 500 MG tablet Take 500 mg by mouth daily as needed (Neck pain).  . Vitamin D, Ergocalciferol, (DRISDOL) 1.25 MG (50000 UNIT) CAPS capsule Take 1 capsule (50,000 Units total) by mouth every 7 (seven) days.   No current facility-administered medications for this visit. (Other)      REVIEW OF SYSTEMS:    ALLERGIES No Known Allergies  PAST MEDICAL HISTORY Past Medical History:  Diagnosis Date  . Allergy   . GERD (gastroesophageal reflux disease)    History reviewed. No pertinent surgical history.  FAMILY HISTORY Family History  Problem Relation Age of Onset  . Fibroids Mother   . Hyperlipidemia Father   . GER disease Father   . Prostate cancer Father   . Diabetes Maternal Grandmother   . Diabetes Paternal Grandmother   . Breast cancer Maternal Aunt     SOCIAL HISTORY Social History   Tobacco Use  . Smoking status: Never Smoker  . Smokeless tobacco: Never Used  Vaping Use  . Vaping Use: Never used  Substance Use Topics  . Alcohol use: Yes    Comment: rare  . Drug use: No         OPHTHALMIC EXAM:  Base Eye Exam    Visual Acuity (Snellen - Linear)      Right Left   Dist cc 20/25 +1 20/25 +2   Dist ph cc 20/20 -2 20/20 -1       Tonometry (Tonopen, 1:08 PM)  Right Left   Pressure 20 21       Pupils      Dark Light Shape React APD   Right 5 4 Round Brisk None   Left 5 4 Round Brisk None       Visual Fields (Counting fingers)      Left Right    Full Full       Extraocular Movement      Right Left    Full, Ortho Full, Ortho       Neuro/Psych    Oriented x3: Yes   Mood/Affect: Normal       Dilation    Both eyes: 1.0% Mydriacyl, 2.5% Phenylephrine @ 1:08 PM        Slit Lamp and Fundus Exam    Slit Lamp Exam      Right Left   Lids/Lashes Normal Normal   Conjunctiva/Sclera Subconjunctival hemorrhage, Chemosis -- both  improving White and quiet   Cornea 1+ Punctate epithelial erosions Clear   Anterior Chamber Deep and quiet Deep and quiet   Iris Round and dilated Round and dilated   Lens Clear Clear   Vitreous Vitreous syneresis, no pigment, gas bubble 30-35% -- single bubble Vitreous syneresis, no pigment       Fundus Exam      Right Left   Disc Pink and Sharp, Tilted disc, temporal PPA Pink and Sharp, mildly Tilted disc, temporal PPA, Compact   Macula Flat, Good foveal reflex, mild RPE mottling, No heme or edema Flat, Good foveal reflex, mild RPE mottling, No heme or edema   Vessels Normal Normal   Periphery Shallow pocket of SRF superotemporal periphery with demarcation line extending from 1100 to 0800 -- laser barricade changes - progressing; mild interval improvement in residual SRF around 0800,  lattice with round, atrophic holes at 1030 and 1045 within detachment, pigmented VR tuft at 0730 outside of detachment -- progressing cryo changes around all lesions Attached, focal pigmented lattice IT quad at 0400, 0430 (with atrophic holes) and 0500        Refraction    Wearing Rx      Sphere Cylinder Axis   Right -8.25 +0.50 055   Left -8.25 +0.50 004       Wearing Rx #2      Sphere Cylinder Axis   Right -7.50 Sphere    Left -7.50 Sphere    Type: CL          IMAGING AND PROCEDURES  Imaging and Procedures for 06/14/2020  OCT, Retina - OU - Both Eyes       Right Eye Quality was good. Central Foveal Thickness: 264. Progression has been stable. Findings include normal foveal contour, no IRF, vitreomacular adhesion , subretinal fluid, outer retinal atrophy (Persistent SRF temporal periphery caught on widefield -- minimal improvement, good ORA / laser scarring posterior to SRF).   Left Eye Quality was good. Central Foveal Thickness: 255. Progression has been stable. Findings include normal foveal contour, no IRF, no SRF, vitreomacular adhesion .   Notes *Images captured and stored on  drive  Diagnosis / Impression:  OD: NFP; no IRF/SRF centrally; Persistent SRF temporal periphery caught on widefield -- minimal improvement;  good ORA / laser scarring posterior to SRF OS: NFP, no IRF/SRF  Clinical management:  See below  Abbreviations: NFP - Normal foveal profile. CME - cystoid macular edema. PED - pigment epithelial detachment. IRF - intraretinal fluid. SRF - subretinal fluid. EZ - ellipsoid zone. ERM - epiretinal  membrane. ORA - outer retinal atrophy. ORT - outer retinal tubulation. SRHM - subretinal hyper-reflective material. IRHM - intraretinal hyper-reflective material               ASSESSMENT/PLAN:    ICD-10-CM   1. Right retinal detachment  H33.21   2. 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 Rhegmatogenous retinal detachment, OD - detached from 11 to 8 oclock, with atrophic holes at 1030 and 1045 - s/p pneumatic retinopexy OD (12.13.21) - s/p supplemental barricade laser retinopexy OD 12.20.21  - s/p laser retinopexy w/ indirect ophthalmoscope OD 12.22.21 - IOP 20 - exam and OCT show persistent shallow SRF in temporal periphery, greatest 8-9 oclock - cryo and laser changes evolving nicely -- good barricade laser in place, but not fully mature - cont Maxitrol ung QID OD - discussed persistent presence of SRF and potential need for surgery for definitive repair - 25g PPV w/ endolaser and gas OD under general anesthesia  - RBA of procedure discussed, questions answered - will schedule surgery for next Thursday, January 6th at 11:30am [facility] OR 8 - post op drop and positioning instructions reviewed - f/u Friday at 8:15am -- POV, DFE/OCT  3,4. Lattice degeneration w/ atrophic holes, both eyes  - OD: lattice with atrophic holes and RD as above - OS: focal pigmented lattice IT at 0400, 0430 and 0500 --0430 with atrophic holes - discussed findings,  prognosis, and treatment options including observation - recommend laser retinopexy OS once OD is stable from RD repair or intra-op with surgical repair for OD next week - monitor for now  Ophthalmic Meds Ordered this visit:  No orders of the defined types were placed in this encounter.     Return in about 2 days (around 06/16/2020) for f/u RD OD, DFE, OCT.  There are no Patient Instructions on file for this visit.  Explained the diagnoses, plan, and follow up with the patient and they expressed understanding.  Patient expressed understanding of the importance of proper follow up care.   This document serves as a record of services personally performed by Karie Chimera, MD, PhD. It was created on their behalf by Annalee Genta, COMT. The creation of this record is the provider's dictation and/or activities during the visit.  Electronically signed by: Annalee Genta, COMT 06/14/20 5:13 PM   This document serves as a record of services personally performed by Karie Chimera, MD, PhD. It was created on their behalf by Glee Arvin. Manson Passey, OA an ophthalmic technician. The creation of this record is the provider's dictation and/or activities during the visit.    Electronically signed by: Glee Arvin. Manson Passey, [place] 12.29.2021 5:13 PM  Karie Chimera, M.D., Ph.D. Diseases & Surgery of the Retina and Vitreous [facility]  I have reviewed the above documentation for accuracy and completeness, and I agree with the above. Karie Chimera, M.D., Ph.D. 06/14/20 5:13 PM  Abbreviations: M myopia (nearsighted); A astigmatism; H hyperopia (farsighted); P presbyopia; Mrx spectacle prescription;  CTL contact lenses; OD right eye; OS left eye; OU both eyes  XT exotropia; ET esotropia; PEK punctate epithelial keratitis; PEE punctate epithelial erosions; DES dry eye syndrome; MGD meibomian gland dysfunction; ATs artificial tears; PFAT's preservative free artificial tears; NSC nuclear sclerotic  cataract; PSC posterior subcapsular cataract; ERM epi-retinal membrane; PVD posterior  vitreous detachment; RD retinal detachment; DM diabetes mellitus; DR diabetic retinopathy; NPDR non-proliferative diabetic retinopathy; PDR proliferative diabetic retinopathy; CSME clinically significant macular edema; DME diabetic macular edema; dbh dot blot hemorrhages; CWS cotton wool spot; POAG primary open angle glaucoma; C/D cup-to-disc ratio; HVF humphrey visual field; GVF goldmann visual field; OCT optical coherence tomography; IOP intraocular pressure; BRVO Branch retinal vein occlusion; CRVO central retinal vein occlusion; CRAO central retinal artery occlusion; BRAO branch retinal artery occlusion; RT retinal tear; SB scleral buckle; PPV pars plana vitrectomy; VH Vitreous hemorrhage; PRP panretinal laser photocoagulation; IVK intravitreal kenalog; VMT vitreomacular traction; MH Macular hole;  NVD neovascularization of the disc; NVE neovascularization elsewhere; AREDS age related eye disease study; ARMD age related macular degeneration; POAG primary open angle glaucoma; EBMD epithelial/anterior basement membrane dystrophy; ACIOL anterior chamber intraocular lens; IOL intraocular lens; PCIOL posterior chamber intraocular lens; Phaco/IOL phacoemulsification with intraocular lens placement; [place] photorefractive keratectomy; LASIK laser assisted in situ keratomileusis; HTN hypertension; DM diabetes mellitus; COPD chronic obstructive pulmonary disease

## 2020-06-15 ENCOUNTER — Encounter (INDEPENDENT_AMBULATORY_CARE_PROVIDER_SITE_OTHER): Payer: Self-pay | Admitting: Ophthalmology

## 2020-06-16 ENCOUNTER — Encounter (INDEPENDENT_AMBULATORY_CARE_PROVIDER_SITE_OTHER): Payer: Self-pay | Admitting: Ophthalmology

## 2020-06-16 ENCOUNTER — Ambulatory Visit (INDEPENDENT_AMBULATORY_CARE_PROVIDER_SITE_OTHER): Payer: No Typology Code available for payment source | Admitting: Ophthalmology

## 2020-06-16 ENCOUNTER — Other Ambulatory Visit: Payer: Self-pay

## 2020-06-16 DIAGNOSIS — H3581 Retinal edema: Secondary | ICD-10-CM

## 2020-06-16 DIAGNOSIS — H3321 Serous retinal detachment, right eye: Secondary | ICD-10-CM | POA: Diagnosis not present

## 2020-06-16 DIAGNOSIS — H35413 Lattice degeneration of retina, bilateral: Secondary | ICD-10-CM | POA: Diagnosis not present

## 2020-06-16 DIAGNOSIS — H33323 Round hole, bilateral: Secondary | ICD-10-CM | POA: Diagnosis not present

## 2020-06-16 NOTE — Progress Notes (Signed)
[facility] - Clinic Note  06/16/2020     CHIEF COMPLAINT Patient presents for Post-op Follow-up   HISTORY OF PRESENT ILLNESS: Savannah Holland is a 34 y.o. female who presents to the clinic today for:   HPI    Post-op Follow-up    In right eye.  Discomfort includes none.  Vision is stable.  I, the attending physician,  performed the HPI with the patient and updated documentation appropriately.          Comments    34 y/o female pt here for 2 day POV OD and pre-op for sx next Thursday, 1.6.22.  S/p pneumatic retinopexy OD 12.13.21.  S/p supplemental barricade laser retinopexy OD 12.20.21.  S/p laser retinopexy w/indirect opthalmoscope OD 12.22.21.  No change in [place] OU.  Denies pain, FOL, floaters.  Maxitrol ung QID OD.       Last edited by Rennis Chris, MD on 06/16/2020 11:19 AM. (History)     Referring physician: Reva Bores, MD [address] [place],  [place] [address]  HISTORICAL INFORMATION:   Selected notes from the MEDICAL RECORD NUMBER Referred by Dr. 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 neomycin-polymyxin-dexameth (MAXITROL) 0.1 % OINT Place 1 application into the right eye 4 (four) times daily.  Marland Kitchen neomycin-polymyxin b-dexamethasone (MAXITROL) 3.5-10000-0.1 OINT Place 1 application into the right eye 4 (four) times daily. (Patient not taking: Reported on 06/16/2020)  . prednisoLONE acetate (PRED FORTE) 1 % ophthalmic suspension Place 1 drop into the right eye 4 (four) times daily. (Patient not taking: Reported on 06/16/2020)   No current facility-administered medications for this visit. (Ophthalmic Drugs)   Current Outpatient Medications (Other)  Medication Sig  . acetaminophen (TYLENOL) 500 MG tablet Take 500 mg by mouth daily as needed (Neck pain).  Marland Kitchen levonorgestrel (MIRENA) 20 MCG/24HR IUD 1 each by Intrauterine  route once.  . Vitamin D, Ergocalciferol, (DRISDOL) 1.25 MG (50000 UNIT) CAPS capsule Take 1 capsule (50,000 Units total) by mouth every 7 (seven) days.   No current facility-administered medications for this visit. (Other)      REVIEW OF SYSTEMS: ROS    Positive for: Eyes   Negative for: Constitutional, Gastrointestinal, Neurological, Skin, Genitourinary, Musculoskeletal, HENT, Endocrine, Cardiovascular, Respiratory, Psychiatric, Allergic/Imm, Heme/Lymph   Last edited by Celine Mans, COA on 06/16/2020  8:05 AM. (History)       ALLERGIES No Known Allergies  PAST MEDICAL HISTORY Past Medical History:  Diagnosis Date  . Allergy   . GERD (gastroesophageal reflux disease)    History reviewed. No pertinent surgical history.  FAMILY HISTORY Family History  Problem Relation Age of Onset  . Fibroids Mother   . Hyperlipidemia Father   . GER disease Father   . Prostate cancer Father   . Diabetes Maternal Grandmother   . Diabetes Paternal Grandmother   . Breast cancer Maternal Aunt     SOCIAL HISTORY Social History   Tobacco Use  . Smoking status: Never Smoker  . Smokeless tobacco: Never Used  Vaping Use  . Vaping Use: Never used  Substance Use Topics  . Alcohol use: Yes    Comment: rare  . Drug use: No         OPHTHALMIC EXAM:  Base Eye Exam    Visual Acuity (Snellen - Linear)      Right Left   Dist cc 20/25  20/25   Dist ph cc NI 20/20   Correction: Glasses       Tonometry (Tonopen, 8:09 AM)      Right Left   Pressure 16 17       Pupils      Dark Light Shape React APD   Right 5 4 Round Brisk None   Left 5 4 Round Brisk None       Visual Fields (Counting fingers)      Left Right    Full Full       Extraocular Movement      Right Left    Full, Ortho Full, Ortho       Neuro/Psych    Oriented x3: Yes   Mood/Affect: Normal       Dilation    Both eyes: 1.0% Mydriacyl, 2.5% Phenylephrine @ 8:09 AM        Slit Lamp and Fundus Exam     Slit Lamp Exam      Right Left   Lids/Lashes Normal Normal   Conjunctiva/Sclera Subconjunctival hemorrhage, Chemosis -- both improving White and quiet   Cornea 1+ Punctate epithelial erosions Clear   Anterior Chamber Deep and quiet Deep and quiet   Iris Round and dilated Round and dilated   Lens Clear Clear   Vitreous Vitreous syneresis, no pigment, gas bubble 25-30% -- single bubble Vitreous syneresis, no pigment       Fundus Exam      Right Left   Disc Pink and Sharp, Tilted disc, temporal PPA Pink and Sharp, mildly Tilted disc, temporal PPA, Compact   C/D Ratio 0.3 0.3   Macula Flat, Good foveal reflex, mild RPE mottling, No heme or edema Flat, Good foveal reflex, mild RPE mottling, No heme or edema   Vessels Normal Normal   Periphery Persistent Shallow pocket of SRF temporally periphery with demarcation line extending from 1100 to 0800 -- laser barricade changes progressing; mild interval improvement in SRF superiorly, but mild interval increase in residual SRF around 0800,  lattice with round, atrophic holes at 1030 and 1045 within detachment, pigmented VR tuft at 0730 outside of detachment -- progressing cryo changes around all lesions Attached, focal pigmented lattice IT quad at 0400, 0430 (with atrophic holes) and 0500          IMAGING AND PROCEDURES  Imaging and Procedures for 06/16/2020  OCT, Retina - OU - Both Eyes       Right Eye Quality was good. Central Foveal Thickness: 265. Progression has been stable. Findings include normal foveal contour, no IRF, vitreomacular adhesion , subretinal fluid, outer retinal atrophy (Persistent SRF temporal periphery caught on widefield -- minimal improvement, good ORA / laser scarring posterior to SRF).   Left Eye Quality was good. Central Foveal Thickness: 255. Progression has been stable. Findings include normal foveal contour, no IRF, no SRF, vitreomacular adhesion .   Notes *Images captured and stored on drive  Diagnosis /  Impression:  OD: NFP; no IRF/SRF centrally; Persistent SRF temporal periphery caught on widefield -- minimal improvement;  good ORA / laser scarring posterior to SRF OS: NFP, no IRF/SRF  Clinical management:  See below  Abbreviations: NFP - Normal foveal profile. CME - cystoid macular edema. PED - pigment epithelial detachment. IRF - intraretinal fluid. SRF - subretinal fluid. EZ - ellipsoid zone. ERM - epiretinal membrane. ORA - outer retinal atrophy. ORT - outer retinal tubulation. SRHM - subretinal hyper-reflective material. IRHM - intraretinal hyper-reflective material  ASSESSMENT/PLAN:    ICD-10-CM   1. Right retinal detachment  H33.21   2. 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 Rhegmatogenous retinal detachment, OD - detached from 11 to 8 oclock, with atrophic holes at 1030 and 1045 - s/p pneumatic retinopexy OD (12.13.21) - s/p supplemental barricade laser retinopexy OD 12.20.21  - s/p laser retinopexy w/ indirect ophthalmoscope OD 12.22.21 - IOP 20 - exam and OCT show persistent shallow SRF in temporal periphery, greatest 8-9 oclock - cryo and laser changes evolving nicely -- good barricade laser in place, but not fully mature - cont Maxitrol ung QID OD -- okay to stop - discussed persistent presence of SRF and potential need for surgery for definitive repair - recommend 25g PPV w/ endolaser and gas OD under general anesthesia  - RBA of procedure discussed, questions answered - surgery scheduled for next Thursday, January 6th at 11:30am [facility] OR 8 - post op drop and positioning instructions reviewed - f/u Monday at 8:15am -- POV, DFE/OCT  3,4. Lattice degeneration w/ atrophic holes, both eyes  - OD: lattice with atrophic holes and RD as above - OS: focal pigmented lattice IT at 0400, 0430 and 0500 --0430 with atrophic holes - discussed  findings, prognosis, and treatment options including observation - recommend laser retinopexy OS once OD is stable from RD repair or intra-op with surgical repair for OD next week - monitor for now  Ophthalmic Meds Ordered this visit:  No orders of the defined types were placed in this encounter.     Return in about 3 days (around 06/19/2020) for as scheduled on Monday.  There are no Patient Instructions on file for this visit.  Explained the diagnoses, plan, and follow up with the patient and they expressed understanding.  Patient expressed understanding of the importance of proper follow up care.   This document serves as a record of services personally performed by Karie Chimera, MD, PhD. It was created on their behalf by Glee Arvin. Manson Passey, OA an ophthalmic technician. The creation of this record is the provider's dictation and/or activities during the visit.    Electronically signed by: Glee Arvin. Manson Passey, [place] 12.31.2021 11:23 AM  Karie Chimera, M.D., Ph.D. Diseases & Surgery of the Retina and Vitreous [facility]  I have reviewed the above documentation for accuracy and completeness, and I agree with the above. Karie Chimera, M.D., Ph.D. 06/16/20 11:23 AM    Abbreviations: M myopia (nearsighted); A astigmatism; H hyperopia (farsighted); P presbyopia; Mrx spectacle prescription;  CTL contact lenses; OD right eye; OS left eye; OU both eyes  XT exotropia; ET esotropia; PEK punctate epithelial keratitis; PEE punctate epithelial erosions; DES dry eye syndrome; MGD meibomian gland dysfunction; ATs artificial tears; PFAT's preservative free artificial tears; NSC nuclear sclerotic cataract; PSC posterior subcapsular cataract; ERM epi-retinal membrane; PVD posterior vitreous detachment; RD retinal detachment; DM diabetes mellitus; DR diabetic retinopathy; NPDR non-proliferative diabetic retinopathy; PDR proliferative diabetic retinopathy; CSME clinically significant  macular edema; DME diabetic macular edema; dbh dot blot hemorrhages; CWS cotton wool spot; POAG primary open angle glaucoma; C/D cup-to-disc ratio; HVF humphrey visual field; GVF goldmann visual field; OCT optical coherence tomography; IOP intraocular pressure; BRVO Branch retinal vein occlusion; CRVO central retinal vein occlusion; CRAO central retinal artery occlusion; BRAO branch retinal artery occlusion; RT retinal tear;  SB scleral buckle; PPV pars plana vitrectomy; VH Vitreous hemorrhage; PRP panretinal laser photocoagulation; IVK intravitreal kenalog; VMT vitreomacular traction; MH Macular hole;  NVD neovascularization of the disc; NVE neovascularization elsewhere; AREDS age related eye disease study; ARMD age related macular degeneration; POAG primary open angle glaucoma; EBMD epithelial/anterior basement membrane dystrophy; ACIOL anterior chamber intraocular lens; IOL intraocular lens; PCIOL posterior chamber intraocular lens; Phaco/IOL phacoemulsification with intraocular lens placement; [place] photorefractive keratectomy; LASIK laser assisted in situ keratomileusis; HTN hypertension; DM diabetes mellitus; COPD chronic obstructive pulmonary disease

## 2020-06-19 ENCOUNTER — Other Ambulatory Visit: Payer: Self-pay

## 2020-06-19 ENCOUNTER — Ambulatory Visit (INDEPENDENT_AMBULATORY_CARE_PROVIDER_SITE_OTHER): Payer: No Typology Code available for payment source | Admitting: Ophthalmology

## 2020-06-19 ENCOUNTER — Encounter (INDEPENDENT_AMBULATORY_CARE_PROVIDER_SITE_OTHER): Payer: Self-pay | Admitting: Ophthalmology

## 2020-06-19 DIAGNOSIS — H3321 Serous retinal detachment, right eye: Secondary | ICD-10-CM

## 2020-06-19 DIAGNOSIS — H3581 Retinal edema: Secondary | ICD-10-CM

## 2020-06-19 DIAGNOSIS — H35413 Lattice degeneration of retina, bilateral: Secondary | ICD-10-CM | POA: Diagnosis not present

## 2020-06-19 DIAGNOSIS — H33323 Round hole, bilateral: Secondary | ICD-10-CM

## 2020-06-19 NOTE — H&P (Signed)
Savannah Holland is an 35 y.o. female.    Chief Complaint: retinal detachment, right eye; lattice degeneration, left eye  HPI: Pt with history of focal peripheral retinal detachment OD s/p pneumatic retinopexy OD, 12.13.21. The superior detachment improved post pneumatic, however, residual SRF has remained inferiorly and progressed slightly. The patient also has mild lattice degeneration OS. After a discussion of the risks benefits and alternatives to surgery, the patient has elected to proceed with surgery to repair the retinal detachment in the right eye and lattice degeneration in the left eye.  Past Medical History:  Diagnosis Date  . Allergy   . GERD (gastroesophageal reflux disease)     No past surgical history on file.  Family History  Problem Relation Age of Onset  . Fibroids Mother   . Hyperlipidemia Father   . GER disease Father   . Prostate cancer Father   . Diabetes Maternal Grandmother   . Diabetes Paternal Grandmother   . Breast cancer Maternal Aunt    Social History:  reports that she has never smoked. She has never used smokeless tobacco. She reports current alcohol use. She reports that she does not use drugs.  Allergies: No Known Allergies  No medications prior to admission.    Review of systems otherwise negative  There were no vitals taken for this visit.  Physical exam: Mental status: oriented x3. Eyes: See eye exam associated with this date of surgery Ears, Nose, Throat: within normal limits Neck: Within Normal limits General: within normal limits Chest: Within normal limits Breast: deferred Heart: Within normal limits Abdomen: Within normal limits GU: deferred Extremities: within normal limits Skin: within normal limits  Assessment/Plan 1. Retinal detachment, right eye 2. Lattice degeneration, left eye  Plan: To [facility] for laser indirect ophthalmoscopy OS and 25g PPV w/ endolaser and gas, OD under general anesthesia - case scheduled  for Thursday, 01.06.22, 1130 am -- [facility] OR 08  Karie Chimera, M.D., Ph.D. Vitreoretinal Surgeon [facility]

## 2020-06-19 NOTE — Progress Notes (Signed)
[facility] - Clinic Note  06/19/2020     CHIEF COMPLAINT Patient presents for Retina Follow Up   HISTORY OF PRESENT ILLNESS: Savannah Holland is a 35 y.o. female who presents to the clinic today for:   HPI    Retina Follow Up    Patient presents with  Retinal Break/Detachment.  In right eye.  This started 3 days ago.  I, the attending physician,  performed the HPI with the patient and updated documentation appropriately.          Comments    Patient here for 3 days retina follow up for RD OD. Patient states vision doing ok. 2 days ago looked like an air bubble floated to top then that was it. No eye pain. No vision changes.       Last edited by Rennis Chris, MD on 06/19/2020 12:24 PM. (History)    pt states no change in vision, but on Saturday she saw bubbles of fluid on the inside of her right eye   Referring physician: Reva Bores, MD [address] [place],  [place] [address]  HISTORICAL INFORMATION:   Selected notes from the MEDICAL RECORD NUMBER Referred by Dr. 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 neomycin-polymyxin b-dexamethasone (MAXITROL) 3.5-10000-0.1 OINT Place 1 application into the right eye 4 (four) times daily. (Patient not taking: Reported on 06/16/2020)  . neomycin-polymyxin-dexameth (MAXITROL) 0.1 % OINT Place 1 application into the right eye 4 (four) times daily.  . prednisoLONE acetate (PRED FORTE) 1 % ophthalmic suspension Place 1 drop into the right eye 4 (four) times daily. (Patient not taking: Reported on 06/16/2020)   No current facility-administered medications for this visit. (Ophthalmic Drugs)   Current Outpatient Medications (Other)  Medication Sig  . acetaminophen (TYLENOL) 500 MG tablet Take 500 mg by mouth daily as needed (Neck pain).  Marland Kitchen levonorgestrel (MIRENA) 20 MCG/24HR IUD 1 each by  Intrauterine route once.  . Vitamin D, Ergocalciferol, (DRISDOL) 1.25 MG (50000 UNIT) CAPS capsule Take 1 capsule (50,000 Units total) by mouth every 7 (seven) days.   No current facility-administered medications for this visit. (Other)      REVIEW OF SYSTEMS: ROS    Positive for: Eyes   Negative for: Constitutional, Gastrointestinal, Neurological, Skin, Genitourinary, Musculoskeletal, HENT, Endocrine, Cardiovascular, Respiratory, Psychiatric, Allergic/Imm, Heme/Lymph   Last edited by Laddie Aquas, COA on 06/19/2020  8:27 AM. (History)       ALLERGIES No Known Allergies  PAST MEDICAL HISTORY Past Medical History:  Diagnosis Date  . Allergy   . GERD (gastroesophageal reflux disease)    History reviewed. No pertinent surgical history.  FAMILY HISTORY Family History  Problem Relation Age of Onset  . Fibroids Mother   . Hyperlipidemia Father   . GER disease Father   . Prostate cancer Father   . Diabetes Maternal Grandmother   . Diabetes Paternal Grandmother   . Breast cancer Maternal Aunt     SOCIAL HISTORY Social History   Tobacco Use  . Smoking status: Never Smoker  . Smokeless tobacco: Never Used  Vaping Use  . Vaping Use: Never used  Substance Use Topics  . Alcohol use: Yes    Comment: rare  . Drug use: No         OPHTHALMIC EXAM:  Base Eye Exam    Visual Acuity (Snellen - Linear)  Right Left   Dist cc 20/25 +2 20/20   Dist ph cc 20/20 -2    Correction: Glasses       Tonometry (Tonopen, 8:24 AM)      Right Left   Pressure 21 17       Pupils      Dark Light Shape React APD   Right 5 4 Round Brisk None   Left 5 4 Round Brisk None       Visual Fields (Counting fingers)      Left Right    Full Full       Extraocular Movement      Right Left    Full, Ortho Full, Ortho       Neuro/Psych    Oriented x3: Yes   Mood/Affect: Normal       Dilation    Both eyes: 1.0% Mydriacyl, 2.5% Phenylephrine @ 8:24 AM        Slit Lamp  and Fundus Exam    Slit Lamp Exam      Right Left   Lids/Lashes Normal Normal   Conjunctiva/Sclera Subconjunctival hemorrhage, Chemosis -- both improving White and quiet   Cornea 1+ Punctate epithelial erosions Clear   Anterior Chamber Deep and quiet Deep and quiet   Iris Round and dilated Round and dilated   Lens Clear Clear   Vitreous Vitreous syneresis, no pigment, gas bubble 20-25% -- single bubble Vitreous syneresis, no pigment       Fundus Exam      Right Left   Disc Pink and Sharp, Tilted disc, temporal PPA Pink and Sharp, mildly Tilted disc, temporal PPA, Compact   C/D Ratio 0.3 0.3   Macula Flat, Good foveal reflex, mild RPE mottling, No heme or edema Flat, Good foveal reflex, mild RPE mottling, No heme or edema   Vessels Normal Normal   Periphery Mild interval increase in SRF IT periphery (extending beyond laser barricade); shallow SRF temporal and superior periphery; lattice with round, atrophic holes at 1030 and 1045 within detachment, pigmented VR tuft at 0730 outside of detachment -- progressing laser and cryo changes around all lesions Attached, focal pigmented lattice IT quad at 0400, 0430 (with atrophic holes) and 0500        Refraction    Wearing Rx      Sphere Cylinder Axis   Right -8.25 +0.50 055   Left -8.25 +0.50 004       Wearing Rx #2      Sphere Cylinder Axis   Right -7.50 Sphere    Left -7.50 Sphere    Type: CL          IMAGING AND PROCEDURES  Imaging and Procedures for 06/19/2020  OCT, Retina - OU - Both Eyes       Right Eye Quality was good. Central Foveal Thickness: 259. Progression has worsened. Findings include normal foveal contour, no IRF, vitreomacular adhesion , subretinal fluid, outer retinal atrophy (Interval increase in SRF IT periphery; good ORA / laser scarring posterior to SRF).   Left Eye Quality was good. Central Foveal Thickness: 250. Progression has been stable. Findings include normal foveal contour, no IRF, no SRF,  vitreomacular adhesion .   Notes *Images captured and stored on drive  Diagnosis / Impression:  OD: NFP; no IRF/SRF centrally; Persistent SRF temporal periphery caught on widefield -- interval increase in SRF inf temp periphery extending beyond laser barricade OS: NFP, no IRF/SRF  Clinical management:  See below  Abbreviations: NFP - Normal  foveal profile. CME - cystoid macular edema. PED - pigment epithelial detachment. IRF - intraretinal fluid. SRF - subretinal fluid. EZ - ellipsoid zone. ERM - epiretinal membrane. ORA - outer retinal atrophy. ORT - outer retinal tubulation. SRHM - subretinal hyper-reflective material. IRHM - intraretinal hyper-reflective material         PROCEDURE NOTE  Diagnosis:  Retinal detachment with retinal tear, RIGHT EYE  Procedure:  Pneumatic retinopexy with supplemental C3F8 gas injection, RIGHT EYE  Surgeon: Bernarda Caffey, M.D.,Ph.D.  Anesthesia:  Subconjunctival Lidocaine   The patient was brought to the procedure room. Informed consent was obtained for intravitreal gas injection. The diagnosis was reviewed with the patient and all questions were answered. The risks of the procedure including potential systemic risks like stroke and heart attack and other thrombotic events as well as the local risks like infection, endophthalmitis, retinal detachment were discussed. The patient consented to the procedure.   The RIGHT eye was marked and a time out was performed identifying the correct eye. Subsequently, the patient was placed in the supine position. Topical anesthetic drops were given and a lid speculum was placed to expose the eye. Subsequently, 2% Lidocaine was injected subconjunctivally in the ST quadrant. The superotemporal quadrant was then cleaned with Betadine drops and allowed to dry. At this time, 4 mm posterior to the limbus utilizing a 30-gauge needle, 0.11 cc of pure, 100% C3F8 gas was injected into the vitreous cavity under direct  visualization. The needle was then withdrawn from the eye and the retina and gas bubble were visualized. An AC tap was performed to normalize the pressure and the central retinal artery was noted to be patent. Betadine was then reapplied to the injection sites and then rinsed with sterile BSS. A drop of polymixin ophthalmic soln and polysporin opthalmic ung were applied to the eye. There were no complications. Discharge and post-operative instructions were reviewed.         ASSESSMENT/PLAN:    ICD-10-CM   1. Right retinal detachment  H33.21   2. 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 Rhegmatogenous retinal detachment, OD - detached from 11 to 8 oclock, with atrophic holes at 1030 and 1045 - s/p pneumatic retinopexy OD (12.13.21) - s/p supplemental barricade laser retinopexy OD 12.20.21  - s/p laser retinopexy w/ indirect ophthalmoscope OD 12.22.21 - IOP 20 - exam and OCT show persistent shallow SRF in temporal periphery, greatest 8-9 oclock -- increased today with SRF extending beyond laser - cryo and laser changes evolving/progressing - discussed persistent presence of SRF and need for surgery for definitive repair - recommend 25g PPV w/ endolaser and gas OD under general anesthesia  - surgery scheduled for Thursday, January 6th at 11:30am [facility] OR 8  - RBA of procedure discussed, questions answered - recommend injection of supplemental C3F8 OD today to protect retina until surgery  - RBA of procedure discussed, questions answered  - informed consent obtained and signed - see procedure note  - post op drop and positioning instructions reviewed - will follow daily until surgery - f/u Tuesday at 8:15am -- POV, DFE/OCT  3,4. Lattice degeneration w/ atrophic holes, both eyes  - OD: lattice with atrophic holes and RD as above - OS: focal pigmented lattice IT at 0400, 0430 and  0500 --0430 with atrophic holes - discussed findings, prognosis, and treatment options  including observation - recommend laser retinopexy OS Thursday with surgical repair of OD on Thursday  Ophthalmic Meds Ordered this visit:  No orders of the defined types were placed in this encounter.     Return in about 1 day (around 06/20/2020) for 815 am - , POV.  There are no Patient Instructions on file for this visit.  Explained the diagnoses, plan, and follow up with the patient and they expressed understanding.  Patient expressed understanding of the importance of proper follow up care.   This document serves as a record of services personally performed by Karie Chimera, MD, PhD. It was created on their behalf by Glee Arvin. Manson Passey, OA an ophthalmic technician. The creation of this record is the provider's dictation and/or activities during the visit.    Electronically signed by: Glee Arvin. Kristopher Oppenheim 01.03.2022 12:35 PM  Karie Chimera, M.D., Ph.D. Diseases & Surgery of the Retina and Vitreous [facility]  I have reviewed the above documentation for accuracy and completeness, and I agree with the above. Karie Chimera, M.D., Ph.D. 06/19/20 12:35 PM  Abbreviations: M myopia (nearsighted); A astigmatism; H hyperopia (farsighted); P presbyopia; Mrx spectacle prescription;  CTL contact lenses; OD right eye; OS left eye; OU both eyes  XT exotropia; ET esotropia; PEK punctate epithelial keratitis; PEE punctate epithelial erosions; DES dry eye syndrome; MGD meibomian gland dysfunction; ATs artificial tears; PFAT's preservative free artificial tears; NSC nuclear sclerotic cataract; PSC posterior subcapsular cataract; ERM epi-retinal membrane; PVD posterior vitreous detachment; RD retinal detachment; DM diabetes mellitus; DR diabetic retinopathy; NPDR non-proliferative diabetic retinopathy; PDR proliferative diabetic retinopathy; CSME clinically significant macular edema; DME diabetic  macular edema; dbh dot blot hemorrhages; CWS cotton wool spot; POAG primary open angle glaucoma; C/D cup-to-disc ratio; HVF humphrey visual field; GVF goldmann visual field; OCT optical coherence tomography; IOP intraocular pressure; BRVO Branch retinal vein occlusion; CRVO central retinal vein occlusion; CRAO central retinal artery occlusion; BRAO branch retinal artery occlusion; RT retinal tear; SB scleral buckle; PPV pars plana vitrectomy; VH Vitreous hemorrhage; PRP panretinal laser photocoagulation; IVK intravitreal kenalog; VMT vitreomacular traction; MH Macular hole;  NVD neovascularization of the disc; NVE neovascularization elsewhere; AREDS age related eye disease study; ARMD age related macular degeneration; POAG primary open angle glaucoma; EBMD epithelial/anterior basement membrane dystrophy; ACIOL anterior chamber intraocular lens; IOL intraocular lens; PCIOL posterior chamber intraocular lens; Phaco/IOL phacoemulsification with intraocular lens placement; PRK photorefractive keratectomy; LASIK laser assisted in situ keratomileusis; HTN hypertension; DM diabetes mellitus; COPD chronic obstructive pulmonary disease

## 2020-06-20 ENCOUNTER — Other Ambulatory Visit (HOSPITAL_COMMUNITY)
Admission: RE | Admit: 2020-06-20 | Discharge: 2020-06-20 | Disposition: A | Discharge status: Home / Self Care | Payer: No Typology Code available for payment source | Source: Ambulatory Visit | Attending: Ophthalmology | Admitting: Ophthalmology

## 2020-06-20 ENCOUNTER — Ambulatory Visit (INDEPENDENT_AMBULATORY_CARE_PROVIDER_SITE_OTHER): Payer: No Typology Code available for payment source | Admitting: Ophthalmology

## 2020-06-20 ENCOUNTER — Encounter (INDEPENDENT_AMBULATORY_CARE_PROVIDER_SITE_OTHER): Payer: Self-pay | Admitting: Ophthalmology

## 2020-06-20 DIAGNOSIS — Z20822 Contact with and (suspected) exposure to covid-19: Secondary | ICD-10-CM | POA: Diagnosis not present

## 2020-06-20 DIAGNOSIS — H33323 Round hole, bilateral: Secondary | ICD-10-CM | POA: Diagnosis not present

## 2020-06-20 DIAGNOSIS — H35413 Lattice degeneration of retina, bilateral: Secondary | ICD-10-CM

## 2020-06-20 DIAGNOSIS — H3321 Serous retinal detachment, right eye: Secondary | ICD-10-CM | POA: Diagnosis not present

## 2020-06-20 DIAGNOSIS — Z01818 Encounter for other preprocedural examination: Secondary | ICD-10-CM | POA: Insufficient documentation

## 2020-06-20 DIAGNOSIS — H3581 Retinal edema: Secondary | ICD-10-CM | POA: Diagnosis not present

## 2020-06-20 LAB — SARS CORONAVIRUS 2 (TAT 6-24 HRS): SARS Coronavirus 2: NEGATIVE

## 2020-06-20 NOTE — Progress Notes (Signed)
[facility] - Clinic Note  06/20/2020     CHIEF COMPLAINT Patient presents for Post-op Follow-up   HISTORY OF PRESENT ILLNESS: Savannah Holland is a 35 y.o. female who presents to the clinic today for:   HPI    Post-op Follow-up    In right eye.  Vision is stable.  I, the attending physician,  performed the HPI with the patient and updated documentation appropriately.       Last edited by Rennis Chris, MD on 06/20/2020  4:50 PM. (History)     Referring physician: Reva Bores, MD [address] [place],  [place] [address]  HISTORICAL INFORMATION:   Selected notes from the MEDICAL RECORD NUMBER Referred by Dr. 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 neomycin-polymyxin b-dexamethasone (MAXITROL) 3.5-10000-0.1 OINT Place 1 application into the right eye 4 (four) times daily. (Patient not taking: Reported on 06/16/2020)  . neomycin-polymyxin-dexameth (MAXITROL) 0.1 % OINT Place 1 application into the right eye 4 (four) times daily.  . prednisoLONE acetate (PRED FORTE) 1 % ophthalmic suspension Place 1 drop into the right eye 4 (four) times daily. (Patient not taking: Reported on 06/16/2020)   No current facility-administered medications for this visit. (Ophthalmic Drugs)   Current Outpatient Medications (Other)  Medication Sig  . acetaminophen (TYLENOL) 500 MG tablet Take 500 mg by mouth daily as needed (Neck pain).  Marland Kitchen levonorgestrel (MIRENA) 20 MCG/24HR IUD 1 each by Intrauterine route once.  . Vitamin D, Ergocalciferol, (DRISDOL) 1.25 MG (50000 UNIT) CAPS capsule Take 1 capsule (50,000 Units total) by mouth every 7 (seven) days.   No current facility-administered medications for this visit. (Other)      REVIEW OF SYSTEMS:    ALLERGIES No Known Allergies  PAST MEDICAL HISTORY Past Medical History:  Diagnosis Date  .  Allergy   . GERD (gastroesophageal reflux disease)    History reviewed. No pertinent surgical history.  FAMILY HISTORY Family History  Problem Relation Age of Onset  . Fibroids Mother   . Hyperlipidemia Father   . GER disease Father   . Prostate cancer Father   . Diabetes Maternal Grandmother   . Diabetes Paternal Grandmother   . Breast cancer Maternal Aunt     SOCIAL HISTORY Social History   Tobacco Use  . Smoking status: Never Smoker  . Smokeless tobacco: Never Used  Vaping Use  . Vaping Use: Never used  Substance Use Topics  . Alcohol use: Yes    Comment: rare  . Drug use: No         OPHTHALMIC EXAM:  Base Eye Exam    Visual Acuity    Snellen - Linear       Neuro/Psych    Oriented x3: Yes   Mood/Affect: Normal        Slit Lamp and Fundus Exam    Slit Lamp Exam      Right Left   Lids/Lashes Normal Normal   Conjunctiva/Sclera Subconjunctival hemorrhage, Chemosis -- both improving White and quiet   Cornea 1+ Punctate epithelial erosions Clear   Anterior Chamber Deep and quiet Deep and quiet   Iris Round and dilated Round and dilated   Lens Clear Clear   Vitreous Vitreous syneresis, no pigment, gas bubble 35% with few satellites Vitreous syneresis, no pigment       Fundus Exam      Right  Left   Disc Pink and Sharp, Tilted disc, temporal PPA Pink and Sharp, mildly Tilted disc, temporal PPA, Compact   C/D Ratio 0.3 0.3   Macula Flat, Good foveal reflex, mild RPE mottling, No heme or edema Flat, Good foveal reflex, mild RPE mottling, No heme or edema   Vessels Normal Normal   Periphery Mild interval increase in SRF IT periphery (extending beyond laser barricade); shallow SRF temporal and superior periphery; lattice with round, atrophic holes at 1030 and 1045 within detachment, pigmented VR tuft at 0730 outside of detachment -- progressing laser and cryo changes around all lesions Attached, focal pigmented lattice IT quad at 0400, 0430 (with atrophic holes)  and 0500          IMAGING AND PROCEDURES  Imaging and Procedures for 06/20/2020  OCT, Retina - OU - Both Eyes       Right Eye Quality was good. Central Foveal Thickness: 263. Progression has been stable. Findings include normal foveal contour, no IRF, vitreomacular adhesion , subretinal fluid, outer retinal atrophy (Persistent temporal SRF greatest IT quad).   Left Eye Quality was good. Central Foveal Thickness: 250. Progression has been stable. Findings include normal foveal contour, no IRF, no SRF, vitreomacular adhesion .   Notes *Images captured and stored on drive  Diagnosis / Impression:  OD: NFP; no IRF/SRF centrally; Persistent temporal SRF greatest IT quad OS: NFP, no IRF/SRF  Clinical management:  See below  Abbreviations: NFP - Normal foveal profile. CME - cystoid macular edema. PED - pigment epithelial detachment. IRF - intraretinal fluid. SRF - subretinal fluid. EZ - ellipsoid zone. ERM - epiretinal membrane. ORA - outer retinal atrophy. ORT - outer retinal tubulation. SRHM - subretinal hyper-reflective material. IRHM - intraretinal hyper-reflective material                  ASSESSMENT/PLAN:    ICD-10-CM   1. Right retinal detachment  H33.21   2. 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 Rhegmatogenous retinal detachment, OD - detached from 11 to 8 oclock, with atrophic holes at 1030 and 1045 - s/p pneumatic retinopexy OD (12.13.21) - s/p supplemental barricade laser retinopexy OD 12.20.21  - s/p laser retinopexy w/ indirect ophthalmoscope OD 12.22.21  - s/p supplemental 100% C3F8 gas (0.1 cc), 01.03.22 - IOP 20 - exam and OCT show persistent shallow SRF in temporal periphery, greatest 8-9 oclock -- increased today with SRF extending beyond laser - cryo and laser changes evolving/progressing - discussed persistent presence of SRF and need  for surgery for definitive repair - recommend 25g PPV w/ endolaser and gas OD under general anesthesia  - surgery scheduled for Thursday, January 6th at 11:30am [facility] OR 8 - post op drop and positioning instructions reviewed -- recommend keeping head upright - f/u Wednesday at 8:15am -- POV, DFE/OCT  3,4. Lattice degeneration w/ atrophic holes, both eyes  - OD: lattice with atrophic holes and RD as above - OS: focal pigmented lattice IT at 0400, 0430 and 0500 --0430 with atrophic holes - discussed findings, prognosis, and treatment options including observation - recommend laser retinopexy OS Thursday with surgical repair of OD on Thursday  Ophthalmic Meds Ordered this visit:  No orders of the defined types were placed in this encounter.     Return in about 1 day (around 06/21/2020) for f/u RD OD,  OCT.  There are no Patient Instructions on file for this visit.  Explained the diagnoses, plan, and follow up with the patient and they expressed understanding.  Patient expressed understanding of the importance of proper follow up care.   This document serves as a record of services personally performed by Karie Chimera, MD, PhD. It was created on their behalf by Glee Arvin. Manson Passey, OA an ophthalmic technician. The creation of this record is the provider's dictation and/or activities during the visit.    Electronically signed by: Glee Arvin. Manson Passey, [place] 01.04.2022 5:12 PM  Karie Chimera, M.D., Ph.D. Diseases & Surgery of the Retina and Vitreous [facility]  I have reviewed the above documentation for accuracy and completeness, and I agree with the above. Karie Chimera, M.D., Ph.D. 06/20/20 5:12 PM   Abbreviations: M myopia (nearsighted); A astigmatism; H hyperopia (farsighted); P presbyopia; Mrx spectacle prescription;  CTL contact lenses; OD right eye; OS left eye; OU both eyes  XT exotropia; ET esotropia; PEK punctate epithelial keratitis; PEE punctate epithelial  erosions; DES dry eye syndrome; MGD meibomian gland dysfunction; ATs artificial tears; PFAT's preservative free artificial tears; NSC nuclear sclerotic cataract; PSC posterior subcapsular cataract; ERM epi-retinal membrane; PVD posterior vitreous detachment; RD retinal detachment; DM diabetes mellitus; DR diabetic retinopathy; NPDR non-proliferative diabetic retinopathy; PDR proliferative diabetic retinopathy; CSME clinically significant macular edema; DME diabetic macular edema; dbh dot blot hemorrhages; CWS cotton wool spot; POAG primary open angle glaucoma; C/D cup-to-disc ratio; HVF humphrey visual field; GVF goldmann visual field; OCT optical coherence tomography; IOP intraocular pressure; BRVO Branch retinal vein occlusion; CRVO central retinal vein occlusion; CRAO central retinal artery occlusion; BRAO branch retinal artery occlusion; RT retinal tear; SB scleral buckle; PPV pars plana vitrectomy; VH Vitreous hemorrhage; PRP panretinal laser photocoagulation; IVK intravitreal kenalog; VMT vitreomacular traction; MH Macular hole;  NVD neovascularization of the disc; NVE neovascularization elsewhere; AREDS age related eye disease study; ARMD age related macular degeneration; POAG primary open angle glaucoma; EBMD epithelial/anterior basement membrane dystrophy; ACIOL anterior chamber intraocular lens; IOL intraocular lens; PCIOL posterior chamber intraocular lens; Phaco/IOL phacoemulsification with intraocular lens placement; PRK photorefractive keratectomy; LASIK laser assisted in situ keratomileusis; HTN hypertension; DM diabetes mellitus; COPD chronic obstructive pulmonary disease

## 2020-06-21 ENCOUNTER — Other Ambulatory Visit: Payer: Self-pay

## 2020-06-21 ENCOUNTER — Encounter (INDEPENDENT_AMBULATORY_CARE_PROVIDER_SITE_OTHER): Payer: Self-pay | Admitting: Ophthalmology

## 2020-06-21 ENCOUNTER — Encounter (HOSPITAL_COMMUNITY): Payer: Self-pay | Admitting: Ophthalmology

## 2020-06-21 ENCOUNTER — Ambulatory Visit (INDEPENDENT_AMBULATORY_CARE_PROVIDER_SITE_OTHER): Payer: No Typology Code available for payment source | Admitting: Ophthalmology

## 2020-06-21 DIAGNOSIS — H33323 Round hole, bilateral: Secondary | ICD-10-CM

## 2020-06-21 DIAGNOSIS — H35413 Lattice degeneration of retina, bilateral: Secondary | ICD-10-CM

## 2020-06-21 DIAGNOSIS — H3581 Retinal edema: Secondary | ICD-10-CM | POA: Diagnosis not present

## 2020-06-21 DIAGNOSIS — H3321 Serous retinal detachment, right eye: Secondary | ICD-10-CM | POA: Diagnosis not present

## 2020-06-21 NOTE — Progress Notes (Signed)
Pre-op phone call complete.  Pt aware of arrive time, NPO status, no lotion/no deodorant, no valuables, no NSAIDS.  Not under the care of a cardiologist. Has remained quarantined since yesterday's Covid test.

## 2020-06-21 NOTE — Progress Notes (Signed)
[facility] - Clinic Note  06/21/2020     CHIEF COMPLAINT Patient presents for Post-op Follow-up   HISTORY OF PRESENT ILLNESS: Savannah Holland is a 35 y.o. female who presents to the clinic today for:   HPI    Post-op Follow-up    In right eye.  Discomfort includes none.  Vision is stable.  I, the attending physician,  performed the HPI with the patient and updated documentation appropriately.          Comments    2 day post op RD OD-  Eye appears stable, no new problems.  Using Maxitrol ung QID OD.       Last edited by Rennis Chris, MD on 06/21/2020  8:52 AM. (History)     Referring physician: Reva Bores, MD [address] [place],  [place] [address]  HISTORICAL INFORMATION:   Selected notes from the MEDICAL RECORD NUMBER Referred by Dr. 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 neomycin-polymyxin-dexameth (MAXITROL) 0.1 % OINT Place 1 application into the right eye 4 (four) times daily.  Marland Kitchen neomycin-polymyxin b-dexamethasone (MAXITROL) 3.5-10000-0.1 OINT Place 1 application into the right eye 4 (four) times daily. (Patient not taking: Reported on 06/21/2020)  . prednisoLONE acetate (PRED FORTE) 1 % ophthalmic suspension Place 1 drop into the right eye 4 (four) times daily. (Patient not taking: No sig reported)   No current facility-administered medications for this visit. (Ophthalmic Drugs)   Current Outpatient Medications (Other)  Medication Sig  . acetaminophen (TYLENOL) 500 MG tablet Take 500 mg by mouth daily as needed (Neck pain).  Marland Kitchen levonorgestrel (MIRENA) 20 MCG/24HR IUD 1 each by Intrauterine route once.  . Vitamin D, Ergocalciferol, (DRISDOL) 1.25 MG (50000 UNIT) CAPS capsule Take 1 capsule (50,000 Units total) by mouth every 7 (seven) days.   No current facility-administered medications for this visit. (Other)       REVIEW OF SYSTEMS: ROS    Positive for: Eyes   Negative for: Constitutional, Gastrointestinal, Neurological, Skin, Genitourinary, Musculoskeletal, HENT, Endocrine, Cardiovascular, Respiratory, Psychiatric, Allergic/Imm, Heme/Lymph   Last edited by Joni Reining, COA on 06/21/2020  8:09 AM. (History)       ALLERGIES No Known Allergies  PAST MEDICAL HISTORY Past Medical History:  Diagnosis Date  . Allergy   . GERD (gastroesophageal reflux disease)   . Pulmonary embolism (HCC) 2018   finished 3 month course of Xarelto   Past Surgical History:  Procedure Laterality Date  . NO PAST SURGERIES      FAMILY HISTORY Family History  Problem Relation Age of Onset  . Fibroids Mother   . Hyperlipidemia Father   . GER disease Father   . Prostate cancer Father   . Diabetes Maternal Grandmother   . Diabetes Paternal Grandmother   . Breast cancer Maternal Aunt     SOCIAL HISTORY Social History   Tobacco Use  . Smoking status: Never Smoker  . Smokeless tobacco: Never Used  Vaping Use  . Vaping Use: Never used  Substance Use Topics  . Alcohol use: Yes    Comment: rare  . Drug use: No         OPHTHALMIC EXAM:  Base Eye Exam    Visual Acuity (Snellen - Linear)      Right Left   Dist cc 20/25 +2 20/20   Correction: Glasses  Tonometry (Tonopen, 8:07 AM)      Right Left   Pressure 19 def       Pupils      Dark Light Shape React APD   Right 5 4 Round Brisk None   Left 5 4 Round Brisk None       Neuro/Psych    Oriented x3: Yes   Mood/Affect: Normal       Dilation    Right eye: 1.0% Mydriacyl, 2.5% Phenylephrine @ 8:07 AM        Slit Lamp and Fundus Exam    Slit Lamp Exam      Right Left   Lids/Lashes Normal Normal   Conjunctiva/Sclera Subconjunctival hemorrhage, Chemosis -- both improving White and quiet   Cornea 1+ Punctate epithelial erosions Clear   Anterior Chamber Deep and quiet Deep and quiet   Iris Round and dilated Round and dilated    Lens Clear Clear   Vitreous Vitreous syneresis, no pigment, gas bubble 35% single gas bubble Vitreous syneresis, no pigment       Fundus Exam      Right Left   Disc Pink and Sharp, Tilted disc, temporal PPA Pink and Sharp, mildly Tilted disc, temporal PPA, Compact   C/D Ratio 0.3 0.3   Macula Flat, Good foveal reflex, mild RPE mottling, No heme or edema Flat, Good foveal reflex, mild RPE mottling, No heme or edema   Vessels Normal Normal   Periphery Persistent SRF IT periphery (extending beyond laser barricade); shallow SRF temporal and superior periphery; lattice with round, atrophic holes at 1030 and 1045 within detachment, pigmented VR tuft at 0730 outside of detachment -- progressing laser and cryo changes around all lesions Attached, focal pigmented lattice IT quad at 0400, 0430 (with atrophic holes) and 0500        Refraction    Wearing Rx      Sphere Cylinder Axis   Right -8.25 +0.50 055   Left -8.25 +0.50 004       Wearing Rx #2      Sphere Cylinder Axis   Right -7.50 Sphere    Left -7.50 Sphere    Type: CL          IMAGING AND PROCEDURES  Imaging and Procedures for 06/21/2020  OCT, Retina - OU - Both Eyes       Right Eye Quality was good. Central Foveal Thickness: 263. Progression has been stable. Findings include normal foveal contour, no IRF, vitreomacular adhesion , subretinal fluid, outer retinal atrophy (Persistent temporal SRF greatest IT quad).   Left Eye Quality was good. Central Foveal Thickness: 250. Progression has been stable. Findings include normal foveal contour, no IRF, no SRF, vitreomacular adhesion .   Notes *Images captured and stored on drive  Diagnosis / Impression:  OD: NFP; no IRF/SRF centrally; Persistent temporal SRF greatest IT quad OS: NFP, no IRF/SRF  Clinical management:  See below  Abbreviations: NFP - Normal foveal profile. CME - cystoid macular edema. PED - pigment epithelial detachment. IRF - intraretinal fluid. SRF -  subretinal fluid. EZ - ellipsoid zone. ERM - epiretinal membrane. ORA - outer retinal atrophy. ORT - outer retinal tubulation. SRHM - subretinal hyper-reflective material. IRHM - intraretinal hyper-reflective material                  ASSESSMENT/PLAN:    ICD-10-CM   1. Right retinal detachment  H33.21   2. 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 Rhegmatogenous retinal detachment, OD - detached from 11 to 8 oclock, with atrophic holes at 1030 and 1045 - s/p pneumatic retinopexy OD (12.13.21) - s/p supplemental barricade laser retinopexy OD 12.20.21  - s/p laser retinopexy w/ indirect ophthalmoscope OD 12.22.21  - s/p supplemental 100% C3F8 gas (0.1 cc), 01.03.22 - IOP 19 - exam and OCT show persistent shallow SRF in temporal periphery, greatest 8-9 oclock -- stable today with SRF extending beyond laser - cryo and laser changes evolving/progressing - discussed persistent presence of SRF and need for surgery for definitive repair - recommend 25g PPV w/ endolaser and gas OD under general anesthesia  - surgery scheduled for Thursday, January 6th at 11:30am [facility] OR 8 - post op drop and positioning instructions reviewed -- recommend keeping head upright - f/u Friday at 800 am -- POV  3,4. Lattice degeneration w/ atrophic holes, both eyes  - OD: lattice with atrophic holes and RD as above - OS: focal pigmented lattice IT at 0400, 0430 and 0500 --0430 with atrophic holes - discussed findings, prognosis, and treatment options including observation - recommend laser retinopexy OS Thursday with surgical repair of OD in OR  Ophthalmic Meds Ordered this visit:  No orders of the defined types were placed in this encounter.     Return in about 2 days (around 06/23/2020) for f/u RD OD, POV.  There are no Patient Instructions on file for this visit.  Explained the diagnoses,  plan, and follow up with the patient and they expressed understanding.  Patient expressed understanding of the importance of proper follow up care.   This document serves as a record of services personally performed by Gardiner Sleeper, MD, PhD. It was created on their behalf by San Jetty. Owens Shark, OA an ophthalmic technician. The creation of this record is the provider's dictation and/or activities during the visit.    Electronically signed by: San Jetty. Owens Shark, [place] 01.05.2022 1:04 PM  Gardiner Sleeper, M.D., Ph.D. Diseases & Surgery of the Retina and Vitreous [facility]  I have reviewed the above documentation for accuracy and completeness, and I agree with the above. Gardiner Sleeper, M.D., Ph.D. 06/21/20 1:04 PM   Abbreviations: M myopia (nearsighted); A astigmatism; H hyperopia (farsighted); P presbyopia; Mrx spectacle prescription;  CTL contact lenses; OD right eye; OS left eye; OU both eyes  XT exotropia; ET esotropia; PEK punctate epithelial keratitis; PEE punctate epithelial erosions; DES dry eye syndrome; MGD meibomian gland dysfunction; ATs artificial tears; PFAT's preservative free artificial tears; [place] nuclear sclerotic cataract; PSC posterior subcapsular cataract; ERM epi-retinal membrane; PVD posterior vitreous detachment; RD retinal detachment; DM diabetes mellitus; DR diabetic retinopathy; NPDR non-proliferative diabetic retinopathy; PDR proliferative diabetic retinopathy; CSME clinically significant macular edema; DME diabetic macular edema; dbh dot blot hemorrhages; CWS cotton wool spot; POAG primary open angle glaucoma; C/D cup-to-disc ratio; HVF humphrey visual field; GVF goldmann visual field; OCT optical coherence tomography; IOP intraocular pressure; BRVO Branch retinal vein occlusion; CRVO central retinal vein occlusion; CRAO central retinal artery occlusion; BRAO branch retinal artery occlusion; RT retinal tear; SB scleral buckle; PPV pars plana vitrectomy; VH  Vitreous hemorrhage; PRP panretinal laser photocoagulation; IVK intravitreal kenalog; VMT vitreomacular traction; MH Macular hole;  NVD neovascularization of the disc; NVE neovascularization elsewhere; AREDS age related eye disease study; ARMD age related macular degeneration; POAG primary open angle glaucoma; EBMD  epithelial/anterior basement membrane dystrophy; ACIOL anterior chamber intraocular lens; IOL intraocular lens; PCIOL posterior chamber intraocular lens; Phaco/IOL phacoemulsification with intraocular lens placement; [place] photorefractive keratectomy; LASIK laser assisted in situ keratomileusis; HTN hypertension; DM diabetes mellitus; COPD chronic obstructive pulmonary disease

## 2020-06-22 ENCOUNTER — Encounter (HOSPITAL_COMMUNITY): Payer: Self-pay | Admitting: Ophthalmology

## 2020-06-22 ENCOUNTER — Ambulatory Visit (HOSPITAL_COMMUNITY): Payer: No Typology Code available for payment source | Admitting: Anesthesiology

## 2020-06-22 ENCOUNTER — Other Ambulatory Visit: Payer: Self-pay

## 2020-06-22 ENCOUNTER — Encounter (HOSPITAL_COMMUNITY)
Admission: RE | Disposition: A | Discharge status: Home / Self Care | Payer: Self-pay | Source: Home / Self Care | Attending: Ophthalmology

## 2020-06-22 ENCOUNTER — Ambulatory Visit (HOSPITAL_COMMUNITY)
Admission: RE | Admit: 2020-06-22 | Discharge: 2020-06-22 | Disposition: A | Discharge status: Home / Self Care | Payer: No Typology Code available for payment source | Attending: Ophthalmology | Admitting: Ophthalmology

## 2020-06-22 DIAGNOSIS — H33001 Unspecified retinal detachment with retinal break, right eye: Secondary | ICD-10-CM | POA: Diagnosis not present

## 2020-06-22 DIAGNOSIS — H338 Other retinal detachments: Secondary | ICD-10-CM | POA: Diagnosis not present

## 2020-06-22 DIAGNOSIS — H33322 Round hole, left eye: Secondary | ICD-10-CM | POA: Insufficient documentation

## 2020-06-22 DIAGNOSIS — H35412 Lattice degeneration of retina, left eye: Secondary | ICD-10-CM | POA: Diagnosis not present

## 2020-06-22 DIAGNOSIS — H33302 Unspecified retinal break, left eye: Secondary | ICD-10-CM | POA: Diagnosis not present

## 2020-06-22 HISTORY — PX: PHOTOCOAGULATION WITH LASER: SHX6027

## 2020-06-22 HISTORY — PX: PARS PLANA VITRECTOMY: SHX2166

## 2020-06-22 HISTORY — PX: LASER PHOTO ABLATION: SHX5942

## 2020-06-22 HISTORY — PX: GAS/FLUID EXCHANGE: SHX5334

## 2020-06-22 HISTORY — PX: GAS INSERTION: SHX5336

## 2020-06-22 LAB — POCT PREGNANCY, URINE: Preg Test, Ur: NEGATIVE

## 2020-06-22 SURGERY — PARS PLANA VITRECTOMY WITH 25 GAUGE
Anesthesia: General | Site: Eye | Laterality: Right

## 2020-06-22 MED ORDER — ACETAMINOPHEN 160 MG/5ML PO SOLN: 325.0000 mg | Freq: Once | ORAL | Status: DC | PRN

## 2020-06-22 MED ORDER — SODIUM CHLORIDE (PF) 0.9 % IJ SOLN
INTRAMUSCULAR | Status: AC
  Filled 2020-06-22: qty 10

## 2020-06-22 MED ORDER — TRIAMCINOLONE ACETONIDE 40 MG/ML IJ SUSP
INTRAMUSCULAR | Status: AC
  Filled 2020-06-22: qty 5

## 2020-06-22 MED ORDER — TRIAMCINOLONE ACETONIDE 40 MG/ML IJ SUSP
INTRAMUSCULAR | Status: DC | PRN
  Administered 2020-06-22: 40 mg

## 2020-06-22 MED ORDER — LIDOCAINE HCL (PF) 2 % IJ SOLN
INTRAMUSCULAR | Status: AC
  Filled 2020-06-22: qty 10

## 2020-06-22 MED ORDER — ACETAMINOPHEN 10 MG/ML IV SOLN
INTRAVENOUS | Status: AC
  Filled 2020-06-22: qty 100

## 2020-06-22 MED ORDER — DEXTROSE 5 % IV SOLN
INTRAVENOUS | Status: AC
  Filled 2020-06-22: qty 100

## 2020-06-22 MED ORDER — SCOPOLAMINE 1 MG/3DAYS TD PT72
MEDICATED_PATCH | TRANSDERMAL | Status: DC | PRN
  Administered 2020-06-22: 1 via TRANSDERMAL

## 2020-06-22 MED ORDER — LACTATED RINGERS IV SOLN: INTRAVENOUS | Status: DC

## 2020-06-22 MED ORDER — DORZOLAMIDE HCL-TIMOLOL MAL 2-0.5 % OP SOLN
OPHTHALMIC | Status: DC | PRN
  Administered 2020-06-22: 1 [drp] via OPHTHALMIC

## 2020-06-22 MED ORDER — MIDAZOLAM HCL 5 MG/5ML IJ SOLN
INTRAMUSCULAR | Status: DC | PRN
  Administered 2020-06-22: 2 mg via INTRAVENOUS

## 2020-06-22 MED ORDER — EPINEPHRINE PF 1 MG/ML IJ SOLN: INTRAOCULAR | Status: DC | PRN

## 2020-06-22 MED ORDER — LIDOCAINE 2% (20 MG/ML) 5 ML SYRINGE
INTRAMUSCULAR | Status: DC | PRN
  Administered 2020-06-22: 100 mg via INTRAVENOUS

## 2020-06-22 MED ORDER — NA CHONDROIT SULF-NA HYALURON 40-30 MG/ML IO SOSY
INTRAOCULAR | Status: DC | PRN
  Administered 2020-06-22: 0.5 mL via INTRAOCULAR

## 2020-06-22 MED ORDER — CEFTAZIDIME 1 G IJ SOLR
INTRAMUSCULAR | Status: AC
  Filled 2020-06-22: qty 1

## 2020-06-22 MED ORDER — MEPERIDINE HCL 25 MG/ML IJ SOLN: 6.2500 mg | INTRAMUSCULAR | Status: DC | PRN

## 2020-06-22 MED ORDER — PROPOFOL 10 MG/ML IV BOLUS
INTRAVENOUS | Status: DC | PRN
  Administered 2020-06-22: 200 mg via INTRAVENOUS

## 2020-06-22 MED ORDER — PHENYLEPHRINE 40 MCG/ML (10ML) SYRINGE FOR IV PUSH (FOR BLOOD PRESSURE SUPPORT)
PREFILLED_SYRINGE | INTRAVENOUS | Status: DC | PRN
  Administered 2020-06-22 (×2): 80 ug via INTRAVENOUS

## 2020-06-22 MED ORDER — BACITRACIN-POLYMYXIN B 500-10000 UNIT/GM OP OINT
TOPICAL_OINTMENT | OPHTHALMIC | Status: DC | PRN
  Administered 2020-06-22: 1 via OPHTHALMIC

## 2020-06-22 MED ORDER — CHLORHEXIDINE GLUCONATE 0.12 % MT SOLN
15.0000 mL | Freq: Once | OROMUCOSAL | Status: AC
  Administered 2020-06-22: 15 mL via OROMUCOSAL
  Filled 2020-06-22: qty 15

## 2020-06-22 MED ORDER — EPINEPHRINE PF 1 MG/ML IJ SOLN
INTRAMUSCULAR | Status: AC
  Filled 2020-06-22: qty 1

## 2020-06-22 MED ORDER — FENTANYL CITRATE (PF) 250 MCG/5ML IJ SOLN
INTRAMUSCULAR | Status: DC | PRN
  Administered 2020-06-22: 100 ug via INTRAVENOUS
  Administered 2020-06-22 (×2): 50 ug via INTRAVENOUS

## 2020-06-22 MED ORDER — GATIFLOXACIN 0.5 % OP SOLN OPTIME - NO CHARGE
OPHTHALMIC | Status: DC | PRN
  Administered 2020-06-22: 1 [drp] via OPHTHALMIC

## 2020-06-22 MED ORDER — PREDNISOLONE ACETATE 1 % OP SUSP
OPHTHALMIC | Status: DC | PRN
  Administered 2020-06-22: 1 [drp] via OPHTHALMIC

## 2020-06-22 MED ORDER — PHENYLEPHRINE HCL-NACL 10-0.9 MG/250ML-% IV SOLN
INTRAVENOUS | Status: DC | PRN
  Administered 2020-06-22: 25 ug/min via INTRAVENOUS

## 2020-06-22 MED ORDER — TOBRAMYCIN-DEXAMETHASONE 0.3-0.1 % OP OINT
TOPICAL_OINTMENT | OPHTHALMIC | Status: AC
  Filled 2020-06-22: qty 3.5

## 2020-06-22 MED ORDER — ACETAZOLAMIDE SODIUM 500 MG IJ SOLR: INTRAMUSCULAR | Status: DC | PRN

## 2020-06-22 MED ORDER — SODIUM CHLORIDE 0.9 % IV SOLN: INTRAVENOUS | Status: DC

## 2020-06-22 MED ORDER — CARBACHOL 0.01 % IO SOLN
INTRAOCULAR | Status: AC
  Filled 2020-06-22: qty 1.5

## 2020-06-22 MED ORDER — ESMOLOL HCL 100 MG/10ML IV SOLN
INTRAVENOUS | Status: DC | PRN
  Administered 2020-06-22: 20 mg via INTRAVENOUS

## 2020-06-22 MED ORDER — BSS IO SOLN
INTRAOCULAR | Status: AC
  Filled 2020-06-22: qty 15

## 2020-06-22 MED ORDER — ATROPINE SULFATE 1 % OP SOLN
1.0000 [drp] | OPHTHALMIC | Status: AC | PRN
  Administered 2020-06-22 (×3): 1 [drp] via OPHTHALMIC
  Filled 2020-06-22: qty 5

## 2020-06-22 MED ORDER — SUGAMMADEX SODIUM 200 MG/2ML IV SOLN
INTRAVENOUS | Status: DC | PRN
  Administered 2020-06-22: 200 mg via INTRAVENOUS

## 2020-06-22 MED ORDER — PHENYLEPHRINE HCL 10 % OP SOLN
1.0000 [drp] | OPHTHALMIC | Status: AC | PRN
  Administered 2020-06-22 (×3): 1 [drp] via OPHTHALMIC
  Filled 2020-06-22: qty 5

## 2020-06-22 MED ORDER — ROCURONIUM BROMIDE 10 MG/ML (PF) SYRINGE
PREFILLED_SYRINGE | INTRAVENOUS | Status: DC | PRN
  Administered 2020-06-22: 40 mg via INTRAVENOUS
  Administered 2020-06-22: 60 mg via INTRAVENOUS

## 2020-06-22 MED ORDER — TROPICAMIDE 1 % OP SOLN
1.0000 [drp] | OPHTHALMIC | Status: AC | PRN
  Administered 2020-06-22 (×3): 1 [drp] via OPHTHALMIC
  Filled 2020-06-22: qty 15

## 2020-06-22 MED ORDER — ATROPINE SULFATE 1 % OP SOLN
OPHTHALMIC | Status: AC
  Filled 2020-06-22: qty 5

## 2020-06-22 MED ORDER — ORAL CARE MOUTH RINSE: 15.0000 mL | Freq: Once | OROMUCOSAL | Status: AC

## 2020-06-22 MED ORDER — BUPIVACAINE HCL (PF) 0.75 % IJ SOLN
INTRAMUSCULAR | Status: AC
  Filled 2020-06-22: qty 10

## 2020-06-22 MED ORDER — BACITRACIN-POLYMYXIN B 500-10000 UNIT/GM OP OINT
TOPICAL_OINTMENT | OPHTHALMIC | Status: AC
  Filled 2020-06-22: qty 3.5

## 2020-06-22 MED ORDER — HYALURONIDASE HUMAN 150 UNIT/ML IJ SOLN
INTRAMUSCULAR | Status: AC
  Filled 2020-06-22: qty 1

## 2020-06-22 MED ORDER — STERILE WATER FOR INJECTION IJ SOLN
INTRAMUSCULAR | Status: DC | PRN
  Administered 2020-06-22: 20 mL

## 2020-06-22 MED ORDER — BRIMONIDINE TARTRATE 0.2 % OP SOLN
OPHTHALMIC | Status: AC
  Filled 2020-06-22: qty 5

## 2020-06-22 MED ORDER — ACETAMINOPHEN 325 MG PO TABS: 325.0000 mg | ORAL_TABLET | Freq: Once | ORAL | Status: DC | PRN

## 2020-06-22 MED ORDER — FENTANYL CITRATE (PF) 100 MCG/2ML IJ SOLN: 25.0000 ug | INTRAMUSCULAR | Status: DC | PRN

## 2020-06-22 MED ORDER — ACETAMINOPHEN 10 MG/ML IV SOLN
1000.0000 mg | Freq: Once | INTRAVENOUS | Status: DC | PRN
  Administered 2020-06-22: 1000 mg via INTRAVENOUS

## 2020-06-22 MED ORDER — BSS IO SOLN
INTRAOCULAR | Status: DC | PRN
  Administered 2020-06-22: 15 mL via INTRAOCULAR

## 2020-06-22 MED ORDER — NA CHONDROIT SULF-NA HYALURON 40-30 MG/ML IO SOSY
INTRAOCULAR | Status: AC
  Filled 2020-06-22: qty 1

## 2020-06-22 MED ORDER — MIDAZOLAM HCL 2 MG/2ML IJ SOLN
INTRAMUSCULAR | Status: AC
  Filled 2020-06-22: qty 2

## 2020-06-22 MED ORDER — GATIFLOXACIN 0.5 % OP SOLN
OPHTHALMIC | Status: AC
  Filled 2020-06-22: qty 2.5

## 2020-06-22 MED ORDER — ACETAZOLAMIDE SODIUM 500 MG IJ SOLR
INTRAMUSCULAR | Status: AC
  Filled 2020-06-22: qty 500

## 2020-06-22 MED ORDER — STERILE WATER FOR INJECTION IJ SOLN
INTRAMUSCULAR | Status: AC
  Filled 2020-06-22: qty 10

## 2020-06-22 MED ORDER — BSS PLUS IO SOLN
INTRAOCULAR | Status: AC
  Filled 2020-06-22: qty 500

## 2020-06-22 MED ORDER — ONDANSETRON HCL 4 MG/2ML IJ SOLN
INTRAMUSCULAR | Status: DC | PRN
  Administered 2020-06-22: 4 mg via INTRAVENOUS

## 2020-06-22 MED ORDER — DEXAMETHASONE SODIUM PHOSPHATE 10 MG/ML IJ SOLN
INTRAMUSCULAR | Status: DC | PRN
  Administered 2020-06-22: 10 mg via INTRAVENOUS

## 2020-06-22 MED ORDER — AMISULPRIDE (ANTIEMETIC) 5 MG/2ML IV SOLN: 10.0000 mg | Freq: Once | INTRAVENOUS | Status: DC | PRN

## 2020-06-22 MED ORDER — PREDNISOLONE ACETATE 1 % OP SUSP
OPHTHALMIC | Status: AC
  Filled 2020-06-22: qty 5

## 2020-06-22 MED ORDER — DORZOLAMIDE HCL-TIMOLOL MAL 2-0.5 % OP SOLN
OPHTHALMIC | Status: AC
  Filled 2020-06-22: qty 10

## 2020-06-22 MED ORDER — FENTANYL CITRATE (PF) 250 MCG/5ML IJ SOLN
INTRAMUSCULAR | Status: AC
  Filled 2020-06-22: qty 5

## 2020-06-22 MED ORDER — INDOCYANINE GREEN 25 MG IV SOLR
INTRAVENOUS | Status: AC
  Filled 2020-06-22: qty 10

## 2020-06-22 MED ORDER — DEXAMETHASONE SODIUM PHOSPHATE 10 MG/ML IJ SOLN
INTRAMUSCULAR | Status: AC
  Filled 2020-06-22: qty 1

## 2020-06-22 MED ORDER — PROPARACAINE HCL 0.5 % OP SOLN
1.0000 [drp] | OPHTHALMIC | Status: AC | PRN
  Administered 2020-06-22 (×3): 1 [drp] via OPHTHALMIC
  Filled 2020-06-22: qty 15

## 2020-06-22 MED ORDER — BRIMONIDINE TARTRATE 0.2 % OP SOLN
OPHTHALMIC | Status: DC | PRN
  Administered 2020-06-22: 1 [drp] via OPHTHALMIC

## 2020-06-22 MED ORDER — POLYMYXIN B SULFATE 500000 UNITS IJ SOLR
INTRAMUSCULAR | Status: AC
  Filled 2020-06-22: qty 500000

## 2020-06-22 SURGICAL SUPPLY — 63 items
APPLICATOR COTTON TIP 6 STRL (MISCELLANEOUS) ×8 IMPLANT
APPLICATOR COTTON TIP 6IN STRL (MISCELLANEOUS) ×12
BAND WRIST GAS GREEN (MISCELLANEOUS) ×2 IMPLANT
BLADE EYE CATARACT 19 1.4 BEAV (BLADE) IMPLANT
BNDG EYE OVAL (GAUZE/BANDAGES/DRESSINGS) ×3 IMPLANT
CABLE BIPOLOR RESECTION CORD (MISCELLANEOUS) ×3 IMPLANT
CANNULA ANT CHAM MAIN (OPHTHALMIC RELATED) IMPLANT
CANNULA FLEX TIP 25G (CANNULA) ×3 IMPLANT
CANNULA TROCAR 23 GA VLV (OPHTHALMIC) IMPLANT
CANNULA VLV SOFT TIP 25GA (OPHTHALMIC) IMPLANT
CLSR STERI-STRIP ANTIMIC 1/2X4 (GAUZE/BANDAGES/DRESSINGS) ×3 IMPLANT
COTTONBALL LRG STERILE PKG (GAUZE/BANDAGES/DRESSINGS) IMPLANT
COVER WAND RF STERILE (DRAPES) IMPLANT
DRAPE MICROSCOPE LEICA 46X105 (MISCELLANEOUS) ×3 IMPLANT
DRAPE OPHTHALMIC 77X100 STRL (CUSTOM PROCEDURE TRAY) ×3 IMPLANT
ERASER HMR WETFIELD 23G BP (MISCELLANEOUS) IMPLANT
FILTER BLUE MILLIPORE (MISCELLANEOUS) IMPLANT
FILTER STRAW FLUID ASPIR (MISCELLANEOUS) IMPLANT
FORCEPS GRIESHABER ILM 25G A (INSTRUMENTS) IMPLANT
GAS AUTO FILL CONSTEL (OPHTHALMIC) ×3
GAS AUTO FILL CONSTELLATION (OPHTHALMIC) ×2 IMPLANT
GAS WRIST BAND GREEN (MISCELLANEOUS) ×1
GLOVE BIO SURGEON STRL SZ7.5 (GLOVE) ×6 IMPLANT
GLOVE BIOGEL M 7.0 STRL (GLOVE) ×3 IMPLANT
GOWN STRL REUS W/ TWL LRG LVL3 (GOWN DISPOSABLE) ×4 IMPLANT
GOWN STRL REUS W/ TWL XL LVL3 (GOWN DISPOSABLE) ×2 IMPLANT
GOWN STRL REUS W/TWL LRG LVL3 (GOWN DISPOSABLE) ×2
GOWN STRL REUS W/TWL XL LVL3 (GOWN DISPOSABLE) ×1
KIT BASIN OR (CUSTOM PROCEDURE TRAY) ×3 IMPLANT
KIT PERFLUORON PROCEDURE 5ML (MISCELLANEOUS) IMPLANT
LENS MACULAR ASPHERIC CONSTEL (OPHTHALMIC) IMPLANT
LENS VITRECTOMY FLAT OCLR DISP (MISCELLANEOUS) IMPLANT
LOOP FINESSE 25 GA (MISCELLANEOUS) IMPLANT
MICROPICK 25G (MISCELLANEOUS)
NEEDLE 18GX1X1/2 (RX/OR ONLY) (NEEDLE) ×9 IMPLANT
NEEDLE 25GX 5/8IN NON SAFETY (NEEDLE) ×9 IMPLANT
NEEDLE HYPO 30X.5 LL (NEEDLE) ×6 IMPLANT
NEEDLE PRECISIONGLIDE 27X1.5 (NEEDLE) IMPLANT
NS IRRIG 1000ML POUR BTL (IV SOLUTION) IMPLANT
PACK VITRECTOMY CUSTOM (CUSTOM PROCEDURE TRAY) ×3 IMPLANT
PAD ARMBOARD 7.5X6 YLW CONV (MISCELLANEOUS) ×6 IMPLANT
PAK PIK VITRECTOMY CVS 25GA (OPHTHALMIC) ×3 IMPLANT
PENCIL BIPOLAR 25GA STR DISP (OPHTHALMIC RELATED) IMPLANT
PICK MICROPICK 25G (MISCELLANEOUS) IMPLANT
PROBE DIATHERMY DSP 27GA (MISCELLANEOUS) IMPLANT
PROBE ENDO DIATHERMY 25G (MISCELLANEOUS) ×3 IMPLANT
PROBE LASER ILLUM FLEX CVD 25G (OPHTHALMIC) ×3 IMPLANT
REPL STRA BRUSH NEEDLE (NEEDLE) IMPLANT
RESERVOIR BACK FLUSH (MISCELLANEOUS) IMPLANT
RETRACTOR IRIS FLEX 25G GRIESH (INSTRUMENTS) IMPLANT
SET INJECTOR OIL FLUID CONSTEL (OPHTHALMIC) IMPLANT
SHIELD EYE LENSE ONLY DISP (GAUZE/BANDAGES/DRESSINGS) ×3 IMPLANT
SPONGE SURGIFOAM ABS GEL 12-7 (HEMOSTASIS) IMPLANT
STOPCOCK 4 WAY LG BORE MALE ST (IV SETS) IMPLANT
SUT VICRYL 7 0 TG140 8 (SUTURE) ×3 IMPLANT
SYR 10ML LL (SYRINGE) ×6 IMPLANT
SYR 20ML LL LF (SYRINGE) IMPLANT
SYR 5ML LL (SYRINGE) ×3 IMPLANT
SYR BULB EAR ULCER 3OZ GRN STR (SYRINGE) IMPLANT
SYR TB 1ML LUER SLIP (SYRINGE) ×6 IMPLANT
TOWEL GREEN STERILE FF (TOWEL DISPOSABLE) ×3 IMPLANT
TUBING HIGH PRESS EXTEN 6IN (TUBING) IMPLANT
WATER STERILE IRR 1000ML POUR (IV SOLUTION) ×3 IMPLANT

## 2020-06-22 NOTE — Transfer of Care (Signed)
Immediate Anesthesia Transfer of Care Note  Patient: Savannah Holland  Procedure(s) Performed: PARS PLANA VITRECTOMY WITH 25 GAUGE (Right Eye) LASER PHOTO ABLATION (Left Eye) PHOTOCOAGULATION WITH LASER (Right Eye) INSERTION OF GAS - C3F8 (Right Eye) GAS/FLUID EXCHANGE (Right Eye)  Patient Location: PACU  Anesthesia Type:General  Level of Consciousness: drowsy  Airway & Oxygen Therapy: Patient Spontanous Breathing and Patient connected to nasal cannula oxygen  Post-op Assessment: Report given to RN and Post -op Vital signs reviewed and stable  Post vital signs: Reviewed and stable  Last Vitals:  Vitals Value Taken Time  BP 103/70 06/22/20 1435  Temp    Pulse 114 06/22/20 1437  Resp 14 06/22/20 1437  SpO2 98 % 06/22/20 1437  Vitals shown include unvalidated device data.  Last Pain:  Vitals:   06/22/20 0939  TempSrc:   PainSc: 0-No pain      Patients Stated Pain Goal: 3 (06/22/20 0939)  Complications: No complications documented.

## 2020-06-22 NOTE — Anesthesia Procedure Notes (Signed)
Procedure Name: Intubation Date/Time: 06/22/2020 11:58 AM Performed by: Elliot Dally, CRNA Pre-anesthesia Checklist: Patient identified, Emergency Drugs available, Suction available and Patient being monitored Patient Re-evaluated:Patient Re-evaluated prior to induction Oxygen Delivery Method: Circle System Utilized Preoxygenation: Pre-oxygenation with 100% oxygen Induction Type: IV induction Ventilation: Mask ventilation without difficulty Laryngoscope Size: Miller and 2 Grade View: Grade I Tube type: Oral Tube size: 7.0 mm Number of attempts: 1 Airway Equipment and Method: Stylet and Oral airway Placement Confirmation: ETT inserted through vocal cords under direct vision,  positive ETCO2 and breath sounds checked- equal and bilateral Secured at: 21 cm Tube secured with: Tape Dental Injury: Teeth and Oropharynx as per pre-operative assessment

## 2020-06-22 NOTE — Anesthesia Preprocedure Evaluation (Addendum)
Anesthesia Evaluation  Patient identified by MRN, date of birth, ID band Patient awake    Reviewed: Allergy & Precautions, NPO status , Patient's Chart, lab work & pertinent test results  Airway Mallampati: I  TM Distance: >3 FB Neck ROM: Full    Dental  (+) Teeth Intact, Dental Advisory Given   Pulmonary    breath sounds clear to auscultation       Cardiovascular negative cardio ROS   Rhythm:Regular Rate:Normal     Neuro/Psych PSYCHIATRIC DISORDERS Anxiety    GI/Hepatic Neg liver ROS, GERD  ,  Endo/Other  negative endocrine ROS  Renal/GU negative Renal ROS     Musculoskeletal negative musculoskeletal ROS (+)   Abdominal Normal abdominal exam  (+)   Peds  Hematology negative hematology ROS (+)   Anesthesia Other Findings   Reproductive/Obstetrics                            Anesthesia Physical Anesthesia Plan  ASA: III  Anesthesia Plan: General   Post-op Pain Management:    Induction: Intravenous  PONV Risk Score and Plan: 4 or greater and Ondansetron, Dexamethasone, Midazolam and Scopolamine patch - Pre-op  Airway Management Planned: Oral ETT  Additional Equipment: None  Intra-op Plan:   Post-operative Plan: Extubation in OR  Informed Consent: I have reviewed the patients History and Physical, chart, labs and discussed the procedure including the risks, benefits and alternatives for the proposed anesthesia with the patient or authorized representative who has indicated his/her understanding and acceptance.       Plan Discussed with: CRNA  Anesthesia Plan Comments:        Anesthesia Quick Evaluation

## 2020-06-22 NOTE — Discharge Instructions (Addendum)
POSTOPERATIVE INSTRUCTIONS  Your doctor has performed vitreoretinal surgery on you at [place].  - Keep eye patched and shielded until seen by Dr. Zamora 8 AM tomorrow in clinic - Do not use drops until return - FACE DOWN POSITIONING WHILE AWAKE - Sleep with belly down or on left side, avoid laying flat on back.    - No strenuous bending, stooping or lifting.  - You may not drive until further notice.  - If your doctor used a gas bubble in your eye during the procedure he will advise you on postoperative positioning. If you have a gas bubble you will be wearing a green bracelet that was applied in the operating room. The green bracelet should stay on as long as the gas bubble is in your eye. While the gas bubble is present you should not fly in an airplane. If you require general anesthesia while the gas bubble is present you must notify your anesthesiologist that an intraocular gas bubble is present so he can take the appropriate precautions.  - Tylenol or any other over-the-counter pain reliever can be used according to your doctor. If more pain medicine is required, your doctor will have a prescription for you.  - You may read, go up and down stairs, and watch television.     Brian Zamora, M.D., Ph.D.  

## 2020-06-22 NOTE — Anesthesia Postprocedure Evaluation (Signed)
Anesthesia Post Note  Patient: Savannah Holland  Procedure(s) Performed: PARS PLANA VITRECTOMY WITH 25 GAUGE (Right Eye) LASER PHOTO ABLATION (Left Eye) PHOTOCOAGULATION WITH LASER (Right Eye) INSERTION OF GAS - C3F8 (Right Eye) GAS/FLUID EXCHANGE (Right Eye)     Patient location during evaluation: PACU Anesthesia Type: General Level of consciousness: awake and alert Pain management: pain level controlled Vital Signs Assessment: post-procedure vital signs reviewed and stable Respiratory status: spontaneous breathing, nonlabored ventilation, respiratory function stable and patient connected to nasal cannula oxygen Cardiovascular status: blood pressure returned to baseline and stable Postop Assessment: no apparent nausea or vomiting Anesthetic complications: no   No complications documented.  Last Vitals:  Vitals:   06/22/20 1501 06/22/20 1515  BP: 107/73   Pulse: 88 100  Resp: 19 17  Temp:  (!) 36.2 C  SpO2: 100% 99%    Last Pain:  Vitals:   06/22/20 1501  TempSrc:   PainSc: 3                  Shelton Silvas

## 2020-06-22 NOTE — Op Note (Signed)
Date of procedure:  01.06.2022   Surgeon: Bernarda Caffey, MD, PhD   Assistant: Ernest Mallick, Ophthalmic Assistant    Pre-operative Diagnoses:  Rhegmatogenous retinal detachment, RIGHT EYE Lattice degeneration with retinal holes, LEFT EYE   Post-operative diagnosis:  same   Anesthesia: General   Procedure:   1. Laser retinopexy via indirect ophthalmoscopy, LEFT EYE 2. 25 gauge pars plana vitrectomy, Right Eye 3. Endolaser, Right Eye 4. Fluid-Air Exchange, Right Eye 5. 14% C3F8 Gas Injection, Right Eye     Indications for procedure: This is a 35 yo F with a history of retinal detachment OD s/p pneumatic cryopexy on 12.13.21. Due to residual subretinal fluid and significant vitreous debris, we recommended 25g PPV w/ endolaser and gas for repair of the persistent retinal detachment. On examination of the left eye, it was found to have focal patches of pigmented lattice degeneration with retinal holes. After discussing the risks, benefits, and alternatives to surgery, the patient electively decided to proceed with surgery of the right eye and laser retinopexy of the left eye, and informed consent was obtained. The surgery was an attempt to barricade the peripheral lattice degeneration in the in the left eye and reattach the retina in the right eye to improve the vision within the reasonable expectations of the surgeon.    Procedure in Detail:    The patient was met in the pre-operative holding area where their identification data was verified.  It was noted that there was a signed, informed consent in the chart and both eyes were verbally verified by the patient--left eye for laser and right eye for surgery. Then the operative right eye and was marked with the surgeon's initials with a marking pen and the left eye was marked with word "laser." The patient was then taken to the operating room and placed in the supine position. General endotracheal anesthesia was induced.   Attention was first  turned to the indirect laser ophthalmoscopy portion of the procedure. Barricade laser retinopexy was performed to focal patches of pigmented lattice in the inferotemporal peripheral retina of the LEFT EYE at from 0400-0500 oclock. Using the laser indirect ophthalmoscope, at least 3 rows of barricade laser around the patches of lattice--615 spots were placed at 240 mW power, 50 ms duration. Following completion of laser retinopexy, lubricating ointment was applied to the left eye and the eyelid was taped closed.   The LEFT EYE was then prepped with 5% betadine and draped in the normal fashion for ophthalmic surgery and a secondary time-out was performed to identify the correct patient, eyes, procedures, and any allergies.    Attention was then turned to the vitrectomy portion of the operation. The microscope was draped and swung into position. A 25 gauge trocar was inserted in a 30-45 degrees fashion into the inferotemporal quadrant 4 mm posterior to the limbus in this phakic patient. Correct positioning within the vitreous was verified externally with the light pipe. The infusion was then connected to the cannula and BSS infusion was commenced.  Additional ports were placed in the superonasal and superotemporal quadrants, also 4 mm posterior to the limbus. Viscoat was placed on the cornea. The BIOM was used to visualize the posterior segment. The patient had a residual C3F8 bubble from the pneumatic cryopexy in the vitreous cavity. The previously detached superotemporal retina was reattached, but there was residual subretinal fluid in the inferotemporal periphery, sparing the macula. There was patches of lattice degeneration with surrounding early cryopexy changes in the temporal quadrant. A  core vitrectomy was performed using the BIOM visualization system, vitrectomy probe and light pipe. The residual C3F8 bubble was removed at this time. Kenalog was used to mark the vitreous and assist in dissecting vitreous  membranes. A posterior vitreous detachment was induced with the vitrectomy probe and kenalog staining using suction over the optic disc.      Vitreous was removed from all the patches and retinal breaks and the vitreous base was shaved with scleral depression to complete a very thorough vitrectomy. A small retinotomy was created at the 8 oclock position outside the IT arcades, using the fine point diathermy probe and the extrusion cannula. A complete fluid-air exchange was performed using the extrusion cannula over the retinotomy and the residual SRF was drained via the soft-tipped extrusion cannula. After completion of these maneuvers, the posterior pole and peripheral retina were noted to be flat. Under air, additional endolaser was applied to the IT retinotomy, posterior to all areas of previous cryopexy and to the anterior peripheral retina 360.    The superotemporal trocar was removed and sutured with 7-0 vicryl in an interrupted fashion.  A complete air to 14% C3F8 gas exchange was performed through the infusion cannula and vented through the superonasal trocar using the extrusion cannula. The superonasal trocar and infusion cannula and associated trocar were then removed and sutured with 7-0 vicryl in an interrupted fashion.    The eye was brought to physiologic IOP via injection of the 14% C3F8 gas with a 30g needle, injected 4 mm posterior to the limbus in the ST quadrant. Subconjunctival injections of Antibiotic and kenalog were administered. The lid speculum and drapes were removed. Drops of an antibiotic, antihypertensives and steroid were given. The eye was patched and shielded. The patient tolerated the procedure well without any intraoperative or immediate postoperative complications. The patient was taken to the recovery room in good condition. The patient was instructed to maintain a strict face-down position and will be seen by Dr. Coralyn Pear tomorrow morning in clinic.

## 2020-06-22 NOTE — Interval H&P Note (Signed)
History and Physical Interval Note:  06/22/2020 9:02 AM  Savannah Holland  has presented today for surgery, with the diagnosis of retinal detachment, right eye.  The various methods of treatment have been discussed with the patient and family. After consideration of risks, benefits and other options for treatment, the patient has consented to  Procedure(s): PARS PLANA VITRECTOMY WITH 25 GAUGE (Right) PHOTOCOAGULATION WITH LASER (Left) as a surgical intervention.  The patient's history has been reviewed, patient examined, no change in status, stable for surgery.  I have reviewed the patient's chart and labs.  Questions were answered to the patient's satisfaction.     Rennis Chris

## 2020-06-22 NOTE — Brief Op Note (Signed)
06/22/2020  2:28 PM  PATIENT:  Savannah Holland  35 y.o. female  PRE-OPERATIVE DIAGNOSIS:  retinal detachment, right eye; lattice degeneration, left eye  POST-OPERATIVE DIAGNOSIS:  retinal detachment, right eye; lattice degeneration, left eye  PROCEDURE:  Procedure(s): PARS PLANA VITRECTOMY WITH 25 GAUGE (Right) LASER PHOTO ABLATION (Left) PHOTOCOAGULATION WITH LASER (Right) INSERTION OF GAS - C3F8 (Right) GAS/FLUID EXCHANGE (Right)  SURGEON:  Surgeon(s) and Role:    Rennis Chris, MD - Primary  ASSISTANTS: Laurian Brim, Ophthalmic Assistant    ANESTHESIA:   general  EBL:  2 mL   BLOOD ADMINISTERED:none  DRAINS: none   LOCAL MEDICATIONS USED:  NONE  SPECIMEN:  No Specimen  DISPOSITION OF SPECIMEN:  N/A  COUNTS:  YES  TOURNIQUET:  * No tourniquets in log *  DICTATION: .Note written in EPIC  PLAN OF CARE: Discharge to home after PACU  PATIENT DISPOSITION:  PACU - hemodynamically stable.   Delay start of Pharmacological VTE agent (>24hrs) due to surgical blood loss or risk of bleeding: not applicable

## 2020-06-23 ENCOUNTER — Ambulatory Visit (INDEPENDENT_AMBULATORY_CARE_PROVIDER_SITE_OTHER): Payer: No Typology Code available for payment source | Admitting: Ophthalmology

## 2020-06-23 ENCOUNTER — Encounter (HOSPITAL_COMMUNITY): Payer: Self-pay | Admitting: Ophthalmology

## 2020-06-23 DIAGNOSIS — H3321 Serous retinal detachment, right eye: Secondary | ICD-10-CM

## 2020-06-23 DIAGNOSIS — H33323 Round hole, bilateral: Secondary | ICD-10-CM

## 2020-06-23 DIAGNOSIS — H3581 Retinal edema: Secondary | ICD-10-CM

## 2020-06-23 DIAGNOSIS — H35413 Lattice degeneration of retina, bilateral: Secondary | ICD-10-CM

## 2020-06-23 NOTE — Progress Notes (Signed)
[facility] - Clinic Note  06/23/2020     CHIEF COMPLAINT Patient presents for Post-op Follow-up   HISTORY OF PRESENT ILLNESS: Savannah Holland is a 35 y.o. female who presents to the clinic today for:   HPI    Post-op Follow-up    In both eyes.  Discomfort includes foreign body sensation.  I, the attending physician,  performed the HPI with the patient and updated documentation appropriately.          Comments    1 day po RD s/p PPV OD- FB sensation, scratchy, like a paper cut OD.       Last edited by Rennis Chris, MD on 06/23/2020  9:33 AM. (History)    pt states she was worried about putting pressure on her eye last night, she states it felt like she had "paper cuts" in it, she states left eye feels okay  Referring physician: Reva Bores, MD [address] [place],  [place] [address]  HISTORICAL INFORMATION:   Selected notes from the MEDICAL RECORD NUMBER Referred by Dr. 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 prednisoLONE acetate (PRED FORTE) 1 % ophthalmic suspension Place 1 drop into the right eye 4 (four) times daily. (Patient not taking: No sig reported)   No current facility-administered medications for this visit. (Ophthalmic Drugs)   Current Outpatient Medications (Other)  Medication Sig  . acetaminophen (TYLENOL) 500 MG tablet Take 500 mg by mouth daily as needed (Neck pain).  Marland Kitchen levonorgestrel (MIRENA) 20 MCG/24HR IUD 1 each by Intrauterine route once.  . Vitamin D, Ergocalciferol, (DRISDOL) 1.25 MG (50000 UNIT) CAPS capsule Take 1 capsule (50,000 Units total) by mouth every 7 (seven) days.   No current facility-administered medications for this visit. (Other)      REVIEW OF SYSTEMS: ROS    Positive for: Eyes   Negative for: Constitutional, Gastrointestinal, Neurological, Skin, Genitourinary,  Musculoskeletal, HENT, Endocrine, Cardiovascular, Respiratory, Psychiatric, Allergic/Imm, Heme/Lymph   Last edited by Joni Reining, COA on 06/23/2020  7:52 AM. (History)       ALLERGIES No Known Allergies  PAST MEDICAL HISTORY Past Medical History:  Diagnosis Date  . Allergy   . GERD (gastroesophageal reflux disease)   . Pulmonary embolism (HCC) 2018   finished 3 month course of Xarelto   Past Surgical History:  Procedure Laterality Date  . GAS INSERTION Right 06/22/2020   Procedure: INSERTION OF GAS - C3F8;  Surgeon: Rennis Chris, MD;  Location: [facility] OR;  Service: Ophthalmology;  Laterality: Right;  . GAS/FLUID EXCHANGE Right 06/22/2020   Procedure: GAS/FLUID EXCHANGE;  Surgeon: Rennis Chris, MD;  Location: [facility] OR;  Service: Ophthalmology;  Laterality: Right;  . LASER PHOTO ABLATION Left 06/22/2020   Procedure: LASER PHOTO ABLATION;  Surgeon: Rennis Chris, MD;  Location: [facility] OR;  Service: Ophthalmology;  Laterality: Left;  . NO PAST SURGERIES    . PARS PLANA VITRECTOMY Right 06/22/2020   Procedure: PARS PLANA VITRECTOMY WITH 25 GAUGE;  Surgeon: Rennis Chris, MD;  Location: [facility] OR;  Service: Ophthalmology;  Laterality: Right;  . PHOTOCOAGULATION WITH LASER Right 06/22/2020   Procedure: PHOTOCOAGULATION WITH LASER;  Surgeon: Rennis Chris, MD;  Location: [facility] OR;  Service: Ophthalmology;  Laterality: Right;    FAMILY HISTORY Family History  Problem Relation Age of Onset  . Fibroids Mother   . Hyperlipidemia Father   . GER disease  Father   . Prostate cancer Father   . Diabetes Maternal Grandmother   . Diabetes Paternal Grandmother   . Breast cancer Maternal Aunt     SOCIAL HISTORY Social History   Tobacco Use  . Smoking status: Never Smoker  . Smokeless tobacco: Never Used  Vaping Use  . Vaping Use: Never used  Substance Use Topics  . Alcohol use: Yes    Comment: rare  . Drug use: No         OPHTHALMIC EXAM:  Base Eye Exam    Visual Acuity (Snellen - Linear)       Right Left   Dist cc HM def  Unable to see HM only a shadow OD       Tonometry (Tonopen, 7:57 AM)      Right Left   Pressure 16 def       Pupils      Shape   Right dilated   Left        Extraocular Movement      Right Left    Full Full       Neuro/Psych    Oriented x3: Yes   Mood/Affect: Normal       Dilation    Right eye: 1.0% Mydriacyl, 2.5% Phenylephrine @ 7:57 AM        Slit Lamp and Fundus Exam    Slit Lamp Exam      Right Left   Lids/Lashes Normal Normal   Conjunctiva/Sclera Mild Subconjunctival hemorrhage, minimal injection White and quiet   Cornea trace Punctate epithelial erosions Clear   Anterior Chamber deep; small fibrin reaction and heme inferiorly Deep and quiet   Iris Round and dilated Round and dilated   Lens 2+PC feathering Clear   Vitreous post vitrectomy; good gas fill Vitreous syneresis, no pigment       Fundus Exam      Right Left   Disc Pink and Sharp, Tilted disc, temporal PPA Pink and Sharp, mildly Tilted disc, temporal PPA, Compact   C/D Ratio 0.3 0.3   Macula Hazy view, flat under gas Flat, Good foveal reflex, mild RPE mottling, No heme or edema   Vessels Normal Normal   Periphery Hazy view, retina attached, IT retinotomy with good early laser changes; good 360 laser; ORIGINALLY: Persistent SRF IT periphery (extending beyond laser barricade); shallow SRF temporal and superior periphery; lattice with round, atrophic holes at 1030 and 1045 within detachment, pigmented VR tuft at 0730 outside of detachment Attached, focal pigmented lattice IT quad at 0400, 0430 (with atrophic holes) and 0500          IMAGING AND PROCEDURES  Imaging and Procedures for 06/23/2020            ASSESSMENT/PLAN:    ICD-10-CM   1. Right retinal detachment  H33.21   2. 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 Rhegmatogenous retinal detachment, OD - detached from 11  to 8 oclock, with atrophic holes at 1030 and 1045 - s/p pneumatic retinopexy OD (12.13.21) - s/p supplemental barricade laser retinopexy OD 12.20.21  - s/p laser retinopexy w/ indirect ophthalmoscope OD 12.22.21  - s/p supplemental 100% C3F8 gas (0.1 cc), 01.03.22 - exam and OCT show persistent shallow SRF in temporal periphery, greatest 8-9 oclock -- stable today with SRF extending beyond laser  - now s/p 25g PPV/EL/FAX/14% C3F8 OD, 01.06.22             -  doing well this morning             - retina attached and in good position -- good laser changes             - IOP okay at 16             - start PF 6x/day OD    zymaxid QID OD    Atropine BID OD    PSO ung QID OD              - cont face down positioning x3 days; avoid laying flat on back              - eye shield when sleeping              - post op drop and positioning instructions reviewed              - tylenol/ibuprofen for pain   - f/u 6 days, RD OD, POV  3,4. Lattice degeneration w/ atrophic holes, both eyes  - OD: lattice with atrophic holes and RD as above - OS: focal pigmented lattice IT at 0400, 0430 and 0500 --0430 with atrophic holes - s/p laser retinopexy OS (01.06.21) -- good early laser changes in place - start PF QID OS x7 days  Ophthalmic Meds Ordered this visit:  No orders of the defined types were placed in this encounter.     Return in about 6 days (around 06/29/2020) for f/u RD OD, POV.  There are no Patient Instructions on file for this visit.  Explained the diagnoses, plan, and follow up with the patient and they expressed understanding.  Patient expressed understanding of the importance of proper follow up care.   This document serves as a record of services personally performed by Gardiner Sleeper, MD, PhD. It was created on their behalf by San Jetty. Owens Shark, OA an ophthalmic technician. The creation of this record is the provider's dictation and/or activities during the visit.    Electronically signed  by: San Jetty. Owens Shark, [place] 01.07.2021 9:38 AM  Gardiner Sleeper, M.D., Ph.D. Diseases & Surgery of the Retina and Vitreous [facility]  I have reviewed the above documentation for accuracy and completeness, and I agree with the above. Gardiner Sleeper, M.D., Ph.D. 06/23/20 9:38 AM   Abbreviations: M myopia (nearsighted); A astigmatism; H hyperopia (farsighted); P presbyopia; Mrx spectacle prescription;  CTL contact lenses; OD right eye; OS left eye; OU both eyes  XT exotropia; ET esotropia; PEK punctate epithelial keratitis; PEE punctate epithelial erosions; DES dry eye syndrome; MGD meibomian gland dysfunction; ATs artificial tears; PFAT's preservative free artificial tears; [place] nuclear sclerotic cataract; PSC posterior subcapsular cataract; ERM epi-retinal membrane; PVD posterior vitreous detachment; RD retinal detachment; DM diabetes mellitus; DR diabetic retinopathy; NPDR non-proliferative diabetic retinopathy; PDR proliferative diabetic retinopathy; CSME clinically significant macular edema; DME diabetic macular edema; dbh dot blot hemorrhages; CWS cotton wool spot; POAG primary open angle glaucoma; C/D cup-to-disc ratio; HVF humphrey visual field; GVF goldmann visual field; OCT optical coherence tomography; IOP intraocular pressure; BRVO Branch retinal vein occlusion; CRVO central retinal vein occlusion; CRAO central retinal artery occlusion; BRAO branch retinal artery occlusion; RT retinal tear; SB scleral buckle; PPV pars plana vitrectomy; VH Vitreous hemorrhage; PRP panretinal laser photocoagulation; IVK intravitreal kenalog; VMT vitreomacular traction; MH Macular hole;  NVD neovascularization of the disc; NVE neovascularization elsewhere; AREDS age related eye disease study; ARMD age related macular degeneration; POAG primary  open angle glaucoma; EBMD epithelial/anterior basement membrane dystrophy; ACIOL anterior chamber intraocular lens; IOL intraocular lens; PCIOL posterior  chamber intraocular lens; Phaco/IOL phacoemulsification with intraocular lens placement; PRK photorefractive keratectomy; LASIK laser assisted in situ keratomileusis; HTN hypertension; DM diabetes mellitus; COPD chronic obstructive pulmonary disease

## 2020-06-28 NOTE — Progress Notes (Signed)
[facility] - Clinic Note  06/29/2020     CHIEF COMPLAINT Patient presents for Post-op Follow-up   HISTORY OF PRESENT ILLNESS: Savannah Holland is a 35 y.o. female who presents to the clinic today for:   HPI    Post-op Follow-up    In right eye.  I, the attending physician,  performed the HPI with the patient and updated documentation appropriately.          Comments    7 day post op RD OD-  Vision is still really blurry.  She is able to see the outline of things now instead of just light so that is an improvement. Atropine BID, Prednisolone 6x/d, Bacitracin ung QID, Zymaxid QID OD.         Last edited by Rennis Chris, MD on 06/29/2020  9:33 PM. (History)    pt states she is doing well, she states the Atropine burns when she uses it, she is using PSO ung QID  Referring physician: Reva Bores, MD [address] [place],  [place] [address]  HISTORICAL INFORMATION:   Selected notes from the MEDICAL RECORD NUMBER Referred by Dr. 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 bacitracin-polymyxin b (POLYSPORIN) ophthalmic ointment Place into the right eye 4 (four) times daily. Place a 1/2 inch ribbon of ointment into the lower eyelid.  . prednisoLONE acetate (PRED FORTE) 1 % ophthalmic suspension Place 1 drop into the right eye 4 (four) times daily.   No current facility-administered medications for this visit. (Ophthalmic Drugs)   Current Outpatient Medications (Other)  Medication Sig  . acetaminophen (TYLENOL) 500 MG tablet Take 500 mg by mouth daily as needed (Neck pain).  Marland Kitchen levonorgestrel (MIRENA) 20 MCG/24HR IUD 1 each by Intrauterine route once.  . Vitamin D, Ergocalciferol, (DRISDOL) 1.25 MG (50000 UNIT) CAPS capsule Take 1 capsule (50,000 Units total) by mouth every 7 (seven) days.   No current facility-administered medications  for this visit. (Other)      REVIEW OF SYSTEMS: ROS    Positive for: Gastrointestinal, Eyes   Negative for: Constitutional, Neurological, Skin, Genitourinary, Musculoskeletal, HENT, Endocrine, Cardiovascular, Respiratory, Psychiatric, Allergic/Imm, Heme/Lymph   Last edited by Joni Reining, COA on 06/29/2020  8:52 AM. (History)       ALLERGIES No Known Allergies  PAST MEDICAL HISTORY Past Medical History:  Diagnosis Date  . Allergy   . GERD (gastroesophageal reflux disease)   . Pulmonary embolism (HCC) 2018   finished 3 month course of Xarelto   Past Surgical History:  Procedure Laterality Date  . GAS INSERTION Right 06/22/2020   Procedure: INSERTION OF GAS - C3F8;  Surgeon: Rennis Chris, MD;  [facility];  Service: Ophthalmology;  Laterality: Right;  . GAS/FLUID EXCHANGE Right 06/22/2020   Procedure: GAS/FLUID EXCHANGE;  Surgeon: Rennis Chris, MD;  Location: [facility] OR;  Service: Ophthalmology;  Laterality: Right;  . LASER PHOTO ABLATION Left 06/22/2020   Procedure: LASER PHOTO ABLATION;  Surgeon: Rennis Chris, MD;  Location: [facility] OR;  Service: Ophthalmology;  Laterality: Left;  . NO PAST SURGERIES    . PARS PLANA VITRECTOMY Right 06/22/2020   Procedure: PARS PLANA VITRECTOMY WITH 25 GAUGE;  Surgeon: Rennis Chris, MD;  Location: [facility] OR;  Service: Ophthalmology;  Laterality: Right;  . PHOTOCOAGULATION WITH LASER Right 06/22/2020   Procedure: PHOTOCOAGULATION WITH LASER;  Surgeon: Rennis Chris, MD;  Location:  [facility] OR;  Service: Ophthalmology;  Laterality: Right;    FAMILY HISTORY Family History  Problem Relation Age of Onset  . Fibroids Mother   . Hyperlipidemia Father   . GER disease Father   . Prostate cancer Father   . Diabetes Maternal Grandmother   . Diabetes Paternal Grandmother   . Breast cancer Maternal Aunt     SOCIAL HISTORY Social History   Tobacco Use  . Smoking status: Never Smoker  . Smokeless tobacco: Never Used  Vaping Use  . Vaping Use: Never used   Substance Use Topics  . Alcohol use: Yes    Comment: rare  . Drug use: No         OPHTHALMIC EXAM:  Base Eye Exam    Visual Acuity (Snellen - Linear)      Right Left   Dist cc HM 20/25-   Correction: Glasses       Tonometry (Tonopen, 8:53 AM)      Right Left   Pressure 13 def       Pupils      Shape   Right dilated   Left        Neuro/Psych    Oriented x3: Yes   Mood/Affect: Normal       Dilation    using atropine          Slit Lamp and Fundus Exam    Slit Lamp Exam      Right Left   Lids/Lashes Normal Normal   Conjunctiva/Sclera Mild Subconjunctival hemorrhage, 2+injection temporally, sutures intact White and quiet   Cornea trace Punctate epithelial erosions, trace endopigment inferiorly Clear   Anterior Chamber deep and clear Deep and quiet   Iris Round and dilated Round and dilated   Lens 2+PC feathering, 2+ Posterior subcapsular cataract Clear   Vitreous post vitrectomy; good gas fill Vitreous syneresis, no pigment       Fundus Exam      Right Left   Disc Pink and Sharp, Tilted disc, temporal PPA Pink and Sharp, mildly Tilted disc, temporal PPA, Compact   C/D Ratio 0.3 0.3   Macula flat under gas Flat, Good foveal reflex, mild RPE mottling, No heme or edema   Vessels Normal Normal   Periphery Hazy view, retina attached, IT retinotomy with good early laser changes; good 360 laser; ORIGINALLY: Persistent SRF IT periphery (extending beyond laser barricade); shallow SRF temporal and superior periphery; lattice with round, atrophic holes at 1030 and 1045 within detachment, pigmented VR tuft at 0730 outside of detachment Attached, focal pigmented lattice IT quad at 0400, 0430 (with atrophic holes) and 0500          IMAGING AND PROCEDURES  Imaging and Procedures for 06/29/2020            ASSESSMENT/PLAN:    ICD-10-CM   1. Right retinal detachment  H33.21   2. 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 Rhegmatogenous retinal detachment, OD - detached from 11 to 8 oclock, with atrophic holes at 1030 and 1045 - s/p pneumatic retinopexy OD (12.13.21) - s/p supplemental barricade laser retinopexy OD 12.20.21  - s/p laser retinopexy w/ indirect ophthalmoscope OD 12.22.21  - s/p supplemental 100% C3F8 gas (0.1 cc), 01.03.22 - exam and OCT show persistent shallow SRF in temporal periphery, greatest 8-9 oclock -- SRF extending beyond laser  - now s/p 25g PPV/EL/FAX/14%  C3F8 OD, 01.06.22             - did well this week             - retina attached and in good position -- good laser changes             - IOP okay at 13             - continue PF 6x/day OD -- decrease to QID   zymaxid QID OD -- stop Saturday, 01.15.21   Atropine BID OD -- stop Saturday, 01.15.21   PSO ung QID OD              - cont face down positioning 30 mins/1 hr; avoid laying flat on back              - eye shield when sleeping x1 more week             - post op drop and positioning instructions reviewed              - tylenol/ibuprofen for pain   - f/u 2-3 weeks, RD OD, POV  3,4. Lattice degeneration w/ atrophic holes, both eyes  - OD: lattice with atrophic holes and RD as above - OS: focal pigmented lattice IT at 0400, 0430 and 0500 --0430 with atrophic holes - s/p laser retinopexy OS (01.06.21) -- good early laser changes in place - completed PF QID OS x7 days  Ophthalmic Meds Ordered this visit:  Meds ordered this encounter  Medications  . prednisoLONE acetate (PRED FORTE) 1 % ophthalmic suspension    Sig: Place 1 drop into the right eye 4 (four) times daily.    Dispense:  15 mL    Refill:  0  . bacitracin-polymyxin b (POLYSPORIN) ophthalmic ointment    Sig: Place into the right eye 4 (four) times daily. Place a 1/2 inch ribbon of ointment into the lower eyelid.    Dispense:  3.5 g    Refill:  3      Return for f/u 2-3 weeks, RD OD, DFE, OCT.  There are no  Patient Instructions on file for this visit.  Explained the diagnoses, plan, and follow up with the patient and they expressed understanding.  Patient expressed understanding of the importance of proper follow up care.   This document serves as a record of services personally performed by Karie Chimera, MD, PhD. It was created on their behalf by Joni Reining, an ophthalmic technician. The creation of this record is the provider's dictation and/or activities during the visit.    Electronically signed by: Joni Reining COA, 06/29/20  9:37 PM  Karie Chimera, M.D., Ph.D. Diseases & Surgery of the Retina and Vitreous [facility]  I have reviewed the above documentation for accuracy and completeness, and I agree with the above. Karie Chimera, M.D., Ph.D. 06/29/20 9:37 PM   Abbreviations: M myopia (nearsighted); A astigmatism; H hyperopia (farsighted); P presbyopia; Mrx spectacle prescription;  CTL contact lenses; OD right eye; OS left eye; OU both eyes  XT exotropia; ET esotropia; PEK punctate epithelial keratitis; PEE punctate epithelial erosions; DES dry eye syndrome; MGD meibomian gland dysfunction; ATs artificial tears; PFAT's preservative free artificial tears; NSC nuclear sclerotic cataract; PSC posterior subcapsular cataract; ERM epi-retinal membrane; PVD posterior vitreous detachment; RD retinal detachment; DM diabetes mellitus; DR diabetic retinopathy; NPDR non-proliferative diabetic retinopathy; PDR proliferative diabetic retinopathy; CSME clinically significant macular edema; DME diabetic  macular edema; dbh dot blot hemorrhages; CWS cotton wool spot; POAG primary open angle glaucoma; C/D cup-to-disc ratio; HVF humphrey visual field; GVF goldmann visual field; OCT optical coherence tomography; IOP intraocular pressure; BRVO Branch retinal vein occlusion; CRVO central retinal vein occlusion; CRAO central retinal artery occlusion; BRAO branch retinal artery occlusion; RT  retinal tear; SB scleral buckle; PPV pars plana vitrectomy; VH Vitreous hemorrhage; PRP panretinal laser photocoagulation; IVK intravitreal kenalog; VMT vitreomacular traction; MH Macular hole;  NVD neovascularization of the disc; NVE neovascularization elsewhere; AREDS age related eye disease study; ARMD age related macular degeneration; POAG primary open angle glaucoma; EBMD epithelial/anterior basement membrane dystrophy; ACIOL anterior chamber intraocular lens; IOL intraocular lens; PCIOL posterior chamber intraocular lens; Phaco/IOL phacoemulsification with intraocular lens placement; PRK photorefractive keratectomy; LASIK laser assisted in situ keratomileusis; HTN hypertension; DM diabetes mellitus; COPD chronic obstructive pulmonary disease

## 2020-06-29 ENCOUNTER — Other Ambulatory Visit: Payer: Self-pay

## 2020-06-29 ENCOUNTER — Encounter (INDEPENDENT_AMBULATORY_CARE_PROVIDER_SITE_OTHER): Payer: Self-pay | Admitting: Ophthalmology

## 2020-06-29 ENCOUNTER — Ambulatory Visit (INDEPENDENT_AMBULATORY_CARE_PROVIDER_SITE_OTHER): Payer: No Typology Code available for payment source | Admitting: Ophthalmology

## 2020-06-29 ENCOUNTER — Other Ambulatory Visit (INDEPENDENT_AMBULATORY_CARE_PROVIDER_SITE_OTHER): Payer: Self-pay | Admitting: Ophthalmology

## 2020-06-29 DIAGNOSIS — H35413 Lattice degeneration of retina, bilateral: Secondary | ICD-10-CM

## 2020-06-29 DIAGNOSIS — H3321 Serous retinal detachment, right eye: Secondary | ICD-10-CM

## 2020-06-29 DIAGNOSIS — H33323 Round hole, bilateral: Secondary | ICD-10-CM

## 2020-06-29 DIAGNOSIS — H3581 Retinal edema: Secondary | ICD-10-CM

## 2020-06-29 MED ORDER — PREDNISOLONE ACETATE 1 % OP SUSP: 1.0000 [drp] | Freq: Four times a day (QID) | OPHTHALMIC | 0 refills | Status: DC

## 2020-06-29 MED ORDER — BACITRACIN-POLYMYXIN B 500-10000 UNIT/GM OP OINT: TOPICAL_OINTMENT | Freq: Four times a day (QID) | OPHTHALMIC | 3 refills | Status: DC

## 2020-06-29 MED FILL — PREDNISOLONE AC 1% EYE DROP: 1 | 75 days supply | Qty: 15 | Fill #0

## 2020-06-29 MED FILL — BACITRACIN-POLYMYXIN EYE OI: 500-10000 | 7 days supply | Qty: 4 | Fill #0

## 2020-07-10 NOTE — Progress Notes (Signed)
[facility] - Clinic Note  07/12/2020     CHIEF COMPLAINT Patient presents for Retina Follow Up   HISTORY OF PRESENT ILLNESS: Savannah Holland is a 35 y.o. female who presents to the clinic today for:   HPI    Retina Follow Up    Patient presents with  Retinal Break/Detachment.  In right eye.  This started 2 weeks ago.  I, the attending physician,  performed the HPI with the patient and updated documentation appropriately.          Comments    Patient here for 2 weeks retina follow up for RD OD. Patient states vision doing ok. Able to see a little more area. Can see over gas bubble now. On occasion has discomfort in am when gets up. After uses drops it goes away. Bubble from mid to lower area.       Last edited by Rennis Chris, MD on 07/12/2020  1:42 PM. (History)    pt states she is still maintaining face down position almost all the time while at home, she is using PF and PSO Ung QID OD  Referring physician: Reva Bores, MD [address] [place],  [place] [address]  HISTORICAL INFORMATION:   Selected notes from the MEDICAL RECORD NUMBER Referred by Dr. 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 bacitracin-polymyxin b (POLYSPORIN) ophthalmic ointment Place into the right eye 4 (four) times daily. Place a 1/2 inch ribbon of ointment into the lower eyelid.  . prednisoLONE acetate (PRED FORTE) 1 % ophthalmic suspension Place 1 drop into the right eye 4 (four) times daily.   No current facility-administered medications for this visit. (Ophthalmic Drugs)   Current Outpatient Medications (Other)  Medication Sig  . acetaminophen (TYLENOL) 500 MG tablet Take 500 mg by mouth daily as needed (Neck pain).  Marland Kitchen levonorgestrel (MIRENA) 20 MCG/24HR IUD 1 each by Intrauterine route once.  . Vitamin D, Ergocalciferol, (DRISDOL) 1.25 MG (50000  UNIT) CAPS capsule Take 1 capsule (50,000 Units total) by mouth every 7 (seven) days.   No current facility-administered medications for this visit. (Other)      REVIEW OF SYSTEMS: ROS    Positive for: Gastrointestinal, Eyes   Negative for: Constitutional, Neurological, Skin, Genitourinary, Musculoskeletal, HENT, Endocrine, Cardiovascular, Respiratory, Psychiatric, Allergic/Imm, Heme/Lymph   Last edited by Laddie Aquas, COA on 07/12/2020 10:00 AM. (History)       ALLERGIES No Known Allergies  PAST MEDICAL HISTORY Past Medical History:  Diagnosis Date  . Allergy   . GERD (gastroesophageal reflux disease)   . Pulmonary embolism (HCC) 2018   finished 3 month course of Xarelto   Past Surgical History:  Procedure Laterality Date  . GAS INSERTION Right 06/22/2020   Procedure: INSERTION OF GAS - C3F8;  Surgeon: Rennis Chris, MD;  Location: [facility] OR;  Service: Ophthalmology;  Laterality: Right;  . GAS/FLUID EXCHANGE Right 06/22/2020   Procedure: GAS/FLUID EXCHANGE;  Surgeon: Rennis Chris, MD;  Location: [facility] OR;  Service: Ophthalmology;  Laterality: Right;  . LASER PHOTO ABLATION Left 06/22/2020   Procedure: LASER PHOTO ABLATION;  Surgeon: Rennis Chris, MD;  Location[facility] OR;  Service: Ophthalmology;  Laterality: Left;  . NO PAST SURGERIES    . PARS PLANA VITRECTOMY Right 06/22/2020   Procedure: PARS PLANA VITRECTOMY WITH 25 GAUGE;  Surgeon: Rennis Chris, MD;  Location: [facility] OR;  Service: Ophthalmology;  Laterality: Right;  . PHOTOCOAGULATION WITH LASER Right 06/22/2020   Procedure: PHOTOCOAGULATION WITH LASER;  Surgeon: Rennis Chris, MD;  Location: [facility] OR;  Service: Ophthalmology;  Laterality: Right;    FAMILY HISTORY Family History  Problem Relation Age of Onset  . Fibroids Mother   . Hyperlipidemia Father   . GER disease Father   . Prostate cancer Father   . Diabetes Maternal Grandmother   . Diabetes Paternal Grandmother   . Breast cancer Maternal Aunt     SOCIAL  HISTORY Social History   Tobacco Use  . Smoking status: Never Smoker  . Smokeless tobacco: Never Used  Vaping Use  . Vaping Use: Never used  Substance Use Topics  . Alcohol use: Yes    Comment: rare  . Drug use: No         OPHTHALMIC EXAM:  Base Eye Exam    Visual Acuity (Snellen - Linear)      Right Left   Dist cc CF 2' 20/25 -1   Dist ph cc NI NI   Correction: Glasses       Tonometry (Tonopen, 9:55 AM)      Right Left   Pressure 17 18       Pupils      Dark Light Shape React APD   Right 6 6 Round NR dilated   Left 4 3 Round Brisk None       Visual Fields (Counting fingers)      Left Right    Full    Restrictions  Total inferior temporal, inferior nasal deficiencies       Neuro/Psych    Oriented x3: Yes   Mood/Affect: Normal       Dilation    Both eyes: 1.0% Mydriacyl, 2.5% Phenylephrine @ 9:55 AM        Slit Lamp and Fundus Exam    Slit Lamp Exam      Right Left   Lids/Lashes Normal Normal   Conjunctiva/Sclera Focal 2+ Injection around sutures, sutures dissolving White and quiet   Cornea trace Punctate epithelial erosions, trace endopigment inferiorly Clear   Anterior Chamber deep and clear Deep and quiet   Iris Round and dilated Round and dilated   Lens 2+ Posterior subcapsular cataract Clear   Vitreous post vitrectomy; 55-60% gas bubble  Vitreous syneresis, no pigment       Fundus Exam      Right Left   Disc Pink and Sharp, Tilted disc, temporal PPA Pink and Sharp, mildly Tilted disc, temporal PPA, Compact   C/D Ratio 0.3 0.3   Macula flat under gas Flat, Good foveal reflex, mild RPE mottling, No heme or edema   Vessels Normal Normal   Periphery Hazy view, retina attached, IT retinotomy with good laser changes; good 360 laser; ORIGINALLY: Persistent SRF IT periphery (extending beyond laser barricade); shallow SRF temporal and superior periphery; lattice with round, atrophic holes at 1030 and 1045 within detachment, pigmented VR tuft at 0730  outside of detachment Attached, focal pigmented lattice IT quad at 0400, 0430 (with atrophic holes) and 0500 -- good laser in place        Refraction    Wearing Rx      Sphere Cylinder Axis   Right -8.25 +0.50 055   Left -8.25 +0.50 004       Wearing Rx #2      Sphere Cylinder Axis   Right -7.50 Sphere    Left -7.50 Sphere  Type: CL          IMAGING AND PROCEDURES  Imaging and Procedures for 07/12/2020  OCT, Retina - OU - Both Eyes       Right Eye Findings include (No image today).   Left Eye Quality was good. Central Foveal Thickness: 251. Progression has been stable. Findings include normal foveal contour, no IRF, no SRF, vitreomacular adhesion , myopic contour.   Notes *Images captured and stored on drive  Diagnosis / Impression:  OD: no image today OS: NFP, no IRF/SRF  Clinical management:  See below  Abbreviations: NFP - Normal foveal profile. CME - cystoid macular edema. PED - pigment epithelial detachment. IRF - intraretinal fluid. SRF - subretinal fluid. EZ - ellipsoid zone. ERM - epiretinal membrane. ORA - outer retinal atrophy. ORT - outer retinal tubulation. SRHM - subretinal hyper-reflective material. IRHM - intraretinal hyper-reflective material                  ASSESSMENT/PLAN:    ICD-10-CM   1. Right retinal detachment  H33.21   2. 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 Rhegmatogenous retinal detachment, OD - detached from 11 to 8 oclock, with atrophic holes at 1030 and 1045 - s/p pneumatic retinopexy OD (12.13.21) - s/p supplemental barricade laser retinopexy OD 12.20.21  - s/p laser retinopexy w/ indirect ophthalmoscope OD 12.22.21  - s/p supplemental 100% C3F8 gas (0.1 cc), 01.03.22 - pre op exam and OCT with persistent shallow SRF in temporal periphery, greatest 8-9 oclock -- SRF extending beyond laser  - s/p 25g  PPV/EL/FAX/14% C3F8 OD, 01.06.22             - doing well             - retina attached and in good position -- good laser changes             - IOP okay at 17             - continue PF QID OD   PSO ung QID OD -- bedtime / PRN             - cont face down positioning 30 mins/1 hr; avoid laying flat on back              - post op drop and positioning instructions reviewed   - f/u 3-4 weeks, RD OD, POV  3,4. Lattice degeneration w/ atrophic holes, both eyes  - OD: lattice with atrophic holes and RD as above - OS: focal pigmented lattice IT at 0400, 0430 and 0500 --0430 with atrophic holes - s/p laser retinopexy OS (01.06.21) -- good early laser changes in place - completed Pred Forte qid OS X 7 days  Ophthalmic Meds Ordered this visit:  No orders of the defined types were placed in this encounter.     Return for f/u 3-4 weeks, RD OD, DFE, OCT.  There are no Patient Instructions on file for this visit.  Explained the diagnoses, plan, and follow up with the patient and they expressed understanding.  Patient expressed understanding of the importance of proper follow up care.   This document serves as a record of services personally performed by Karie Chimera, MD, PhD. It was created on their behalf by Annalee Genta, COMT. The creation of this record is the provider's dictation  and/or activities during the visit.  Electronically signed by: Annalee Gentaaryl Barber, COMT 07/12/20 1:49 PM  This document serves as a record of services personally performed by Karie ChimeraBrian G. Tamarick Kovalcik, MD, PhD. It was created on their behalf by Glee ArvinAmanda J. Manson PasseyBrown, OA an ophthalmic technician. The creation of this record is the provider's dictation and/or activities during the visit.    Electronically signed by: Glee ArvinAmanda J. Penn State BerksBrown, [place] 01.26.2022 @ 1:49 PM  Karie ChimeraBrian G. Raymel Cull, M.D., Ph.D. Diseases & Surgery of the Retina and Vitreous [facility]  I have reviewed the above documentation for accuracy and  completeness, and I agree with the above. Karie ChimeraBrian G. Ishana Blades, M.D., Ph.D. 07/12/20 1:49 PM  Abbreviations: M myopia (nearsighted); A astigmatism; H hyperopia (farsighted); P presbyopia; Mrx spectacle prescription;  CTL contact lenses; OD right eye; OS left eye; OU both eyes  XT exotropia; ET esotropia; PEK punctate epithelial keratitis; PEE punctate epithelial erosions; DES dry eye syndrome; MGD meibomian gland dysfunction; ATs artificial tears; PFAT's preservative free artificial tears; NSC nuclear sclerotic cataract; PSC posterior subcapsular cataract; ERM epi-retinal membrane; PVD posterior vitreous detachment; RD retinal detachment; DM diabetes mellitus; DR diabetic retinopathy; NPDR non-proliferative diabetic retinopathy; PDR proliferative diabetic retinopathy; CSME clinically significant macular edema; DME diabetic macular edema; dbh dot blot hemorrhages; CWS cotton wool spot; POAG primary open angle glaucoma; C/D cup-to-disc ratio; HVF humphrey visual field; GVF goldmann visual field; OCT optical coherence tomography; IOP intraocular pressure; BRVO Branch retinal vein occlusion; CRVO central retinal vein occlusion; CRAO central retinal artery occlusion; BRAO branch retinal artery occlusion; RT retinal tear; SB scleral buckle; PPV pars plana vitrectomy; VH Vitreous hemorrhage; PRP panretinal laser photocoagulation; IVK intravitreal kenalog; VMT vitreomacular traction; MH Macular hole;  NVD neovascularization of the disc; NVE neovascularization elsewhere; AREDS age related eye disease study; ARMD age related macular degeneration; POAG primary open angle glaucoma; EBMD epithelial/anterior basement membrane dystrophy; ACIOL anterior chamber intraocular lens; IOL intraocular lens; PCIOL posterior chamber intraocular lens; Phaco/IOL phacoemulsification with intraocular lens placement; PRK photorefractive keratectomy; LASIK laser assisted in situ keratomileusis; HTN hypertension; DM diabetes mellitus; COPD chronic  obstructive pulmonary disease

## 2020-07-12 ENCOUNTER — Other Ambulatory Visit: Payer: Self-pay

## 2020-07-12 ENCOUNTER — Ambulatory Visit (INDEPENDENT_AMBULATORY_CARE_PROVIDER_SITE_OTHER): Payer: No Typology Code available for payment source | Admitting: Ophthalmology

## 2020-07-12 ENCOUNTER — Encounter (INDEPENDENT_AMBULATORY_CARE_PROVIDER_SITE_OTHER): Payer: Self-pay | Admitting: Ophthalmology

## 2020-07-12 DIAGNOSIS — H33323 Round hole, bilateral: Secondary | ICD-10-CM | POA: Diagnosis not present

## 2020-07-12 DIAGNOSIS — H35413 Lattice degeneration of retina, bilateral: Secondary | ICD-10-CM | POA: Diagnosis not present

## 2020-07-12 DIAGNOSIS — H3581 Retinal edema: Secondary | ICD-10-CM | POA: Diagnosis not present

## 2020-07-12 DIAGNOSIS — H3321 Serous retinal detachment, right eye: Secondary | ICD-10-CM | POA: Diagnosis not present

## 2020-07-17 ENCOUNTER — Encounter (INDEPENDENT_AMBULATORY_CARE_PROVIDER_SITE_OTHER): Payer: Self-pay | Admitting: Ophthalmology

## 2020-08-01 NOTE — Progress Notes (Signed)
[facility] - Clinic Note  08/02/2020     CHIEF COMPLAINT Patient presents for Post-op Follow-up   HISTORY OF PRESENT ILLNESS: Savannah Holland is a 35 y.o. female who presents to the clinic today for:   HPI    Post-op Follow-up    In right eye.  Discomfort includes none.  Vision is improved.  I, the attending physician,  performed the HPI with the patient and updated documentation appropriately.          Comments    35 y/o female pt here for 3 wk POV OD.  S/p pneumatic retinopexy OD 12.13.21.  S/p supplemental barricade laser retinopexy OD 12.20.21.  S/p laser retinopexy w/indirect OD 12.22.21.  S/p supplemental 100% C3F8 gas (0.1cc) OD 1.6.22.  S/p 25g PPV OD 1.6.22.  Pt doing well, and VA has greatly improved OD as gas bubble continues to dissolve.  No change in [place] OS.  Denies pain, FOL, but sees an occasional small floater OD.  Continuing face-down positioning 50% of the time.  PF QID OD.  PSO ung QHS OD.       Last edited by Rennis Chris, MD on 08/02/2020 10:35 PM. (History)    pt states her vision is getting better and the gas bubble is getting smaller   Referring physician: Reva Bores, MD [address] [place],  [place] [address]  HISTORICAL INFORMATION:   Selected notes from the MEDICAL RECORD NUMBER Referred by Dr. 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 bacitracin-polymyxin b (POLYSPORIN) ophthalmic ointment Place into the right eye 4 (four) times daily. Place a 1/2 inch ribbon of ointment into the lower eyelid.  . prednisoLONE acetate (PRED FORTE) 1 % ophthalmic suspension Place 1 drop into the right eye 4 (four) times daily.   No current facility-administered medications for this visit. (Ophthalmic Drugs)   Current Outpatient Medications (Other)  Medication Sig  . acetaminophen (TYLENOL) 500 MG tablet Take 500 mg  by mouth daily as needed (Neck pain).  Marland Kitchen levonorgestrel (MIRENA) 20 MCG/24HR IUD 1 each by Intrauterine route once.  . Vitamin D, Ergocalciferol, (DRISDOL) 1.25 MG (50000 UNIT) CAPS capsule Take 1 capsule (50,000 Units total) by mouth every 7 (seven) days.   No current facility-administered medications for this visit. (Other)      REVIEW OF SYSTEMS: ROS    Positive for: Neurological, Eyes   Negative for: Constitutional, Gastrointestinal, Skin, Genitourinary, Musculoskeletal, HENT, Endocrine, Cardiovascular, Respiratory, Psychiatric, Allergic/Imm, Heme/Lymph   Last edited by Celine Mans, COA on 08/02/2020  1:53 PM. (History)       ALLERGIES No Known Allergies  PAST MEDICAL HISTORY Past Medical History:  Diagnosis Date  . Allergy   . GERD (gastroesophageal reflux disease)   . Pulmonary embolism (HCC) 2018   finished 3 month course of Xarelto   Past Surgical History:  Procedure Laterality Date  . EYE SURGERY Right    Rheg RD repair - Dr. Rennis Chris  . GAS INSERTION Right 06/22/2020   Procedure: INSERTION OF GAS - C3F8;  Surgeon: Rennis Chris, MD;  Location: [facility] OR;  Service: Ophthalmology;  Laterality: Right;  . GAS/FLUID EXCHANGE Right 06/22/2020   Procedure: GAS/FLUID EXCHANGE;  Surgeon: Rennis Chris, MD;  Location: [facility] OR;  Service: Ophthalmology;  Laterality: Right;  . LASER PHOTO ABLATION Left 06/22/2020   Procedure: LASER PHOTO ABLATION;  Surgeon: Rennis Chris,  MD;  Location: [facility] OR;  Service: Ophthalmology;  Laterality: Left;  . NO PAST SURGERIES    . PARS PLANA VITRECTOMY Right 06/22/2020   Procedure: PARS PLANA VITRECTOMY WITH 25 GAUGE;  Surgeon: Rennis ChrisZamora, Savannaha Stonerock, MD;  [facility];  Service: Ophthalmology;  Laterality: Right;  . PHOTOCOAGULATION WITH LASER Right 06/22/2020   Procedure: PHOTOCOAGULATION WITH LASER;  Surgeon: Rennis ChrisZamora, Apolonia Ellwood, MD;  [facility];  Service: Ophthalmology;  Laterality: Right;  . RETINAL DETACHMENT SURGERY Right    Dr. Rennis ChrisBrian Kindal Ponti     FAMILY HISTORY Family History  Problem Relation Age of Onset  . Fibroids Mother   . Hyperlipidemia Father   . GER disease Father   . Prostate cancer Father   . Diabetes Maternal Grandmother   . Diabetes Paternal Grandmother   . Breast cancer Maternal Aunt     SOCIAL HISTORY Social History   Tobacco Use  . Smoking status: Never Smoker  . Smokeless tobacco: Never Used  Vaping Use  . Vaping Use: Never used  Substance Use Topics  . Alcohol use: Yes    Comment: rare  . Drug use: No         OPHTHALMIC EXAM:  Base Eye Exam    Visual Acuity (Snellen - Linear)      Right Left   Dist cc 20/30 +2 20/20 -   Dist ph cc 20/25 -2    Correction: Glasses       Tonometry (Tonopen, 1:57 PM)      Right Left   Pressure 15 17       Pupils      Dark Light Shape React APD   Right 4 3 Round Minimal None   Left 4 3 Round Brisk None       Visual Fields (Counting fingers)      Left Right    Full Full       Extraocular Movement      Right Left    Full, Ortho Full, Ortho       Neuro/Psych    Oriented x3: Yes   Mood/Affect: Normal       Dilation    Both eyes: 1.0% Mydriacyl, 2.5% Phenylephrine @ 1:57 PM        Slit Lamp and Fundus Exam    Slit Lamp Exam      Right Left   Lids/Lashes Normal Normal   Conjunctiva/Sclera sutures dissolving, mild hyperema surrounding dissolving sutures White and quiet   Cornea Clear Clear   Anterior Chamber deep and clear Deep and quiet   Iris Round and dilated Round and dilated   Lens 2+ Posterior subcapsular cataract Clear   Vitreous post vitrectomy; 30% gas bubble  Vitreous syneresis, no pigment       Fundus Exam      Right Left   Disc Pink and Sharp, Tilted disc, temporal PPA Pink and Sharp, mildly Tilted disc, temporal PPA, Compact   C/D Ratio 0.3 0.3   Macula Flat, Blunted foveal reflex, No heme or edema Flat, Good foveal reflex, mild RPE mottling, No heme or edema   Vessels mild attenuation Normal   Periphery Hazy  view, retina attached, IT retinotomy with good laser changes; good 360 laser; ORIGINALLY: Persistent SRF IT periphery (extending beyond laser barricade); shallow SRF temporal and superior periphery; lattice with round, atrophic holes at 1030 and 1045 within detachment, pigmented VR tuft at 0730 outside of detachment Attached, focal pigmented lattice IT quad at 0400, 0430 (with atrophic holes) and 0500 --  good laser in place          IMAGING AND PROCEDURES  Imaging and Procedures for 08/02/2020  OCT, Retina - OU - Both Eyes       Right Eye Quality was good. Central Foveal Thickness: 264. Progression has improved. Findings include normal foveal contour, no IRF, no SRF, myopic contour.   Left Eye Quality was good. Central Foveal Thickness: 248. Progression has been stable. Findings include normal foveal contour, no IRF, no SRF, vitreomacular adhesion , myopic contour.   Notes *Images captured and stored on drive  Diagnosis / Impression:  NFP, no IRF/SRF OU  Clinical management:  See below  Abbreviations: NFP - Normal foveal profile. CME - cystoid macular edema. PED - pigment epithelial detachment. IRF - intraretinal fluid. SRF - subretinal fluid. EZ - ellipsoid zone. ERM - epiretinal membrane. ORA - outer retinal atrophy. ORT - outer retinal tubulation. SRHM - subretinal hyper-reflective material. IRHM - intraretinal hyper-reflective material                  ASSESSMENT/PLAN:    ICD-10-CM   1. Right retinal detachment  H33.21   2. 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 Rhegmatogenous retinal detachment, OD - detached from 11 to 8 oclock, with atrophic holes at 1030 and 1045 - s/p pneumatic retinopexy OD (12.13.21) - s/p supplemental barricade laser retinopexy OD 12.20.21  - s/p laser retinopexy w/ indirect ophthalmoscope OD 12.22.21  - s/p supplemental  100% C3F8 gas (0.1 cc), 01.03.22 - pre op exam and OCT with persistent shallow SRF in temporal periphery, greatest 8-9 oclock -- SRF extending beyond laser  - s/p 25g PPV/EL/FAX/14% C3F8 OD, 01.06.22             - doing well             - retina attached and in good position -- good laser changes  - 30% gas bubble             - IOP 15             - decrease PF to BID OD  - PSO ung qhs / prn OD             - cont face down positioning 30 mins/1 hr; can sleep on either side, avoid laying flat on back   - okay to do computer work; still cannot drive             - post op drop and positioning instructions reviewed   - f/u 3-4 weeks, RD OD, POV  3,4. Lattice degeneration w/ atrophic holes, both eyes  - OD: lattice with atrophic holes and RD as above - OS: focal pigmented lattice IT at 0400, 0430 and 0500 --0430 with atrophic holes - s/p laser retinopexy OS (01.06.21) -- good laser changes in place  Ophthalmic Meds Ordered this visit:  No orders of the defined types were placed in this encounter.     Return for f/u 3-4 weeks, RD OD, DFE, OCT.  There are no Patient Instructions on file for this visit.  Explained the diagnoses, plan, and follow up with the patient and they expressed understanding.  Patient expressed understanding of the importance of proper follow up care.   This document serves as a record of services personally performed by Karie Chimera,  MD, PhD. It was created on their behalf by Annalee Genta, COMT. The creation of this record is the provider's dictation and/or activities during the visit.  Electronically signed by: Annalee Genta, COMT 08/02/20 10:41 PM   This document serves as a record of services personally performed by Karie Chimera, MD, PhD. It was created on their behalf by Glee Arvin. Manson Passey, OA an ophthalmic technician. The creation of this record is the provider's dictation and/or activities during the visit.    Electronically signed by: Glee Arvin. Manson Passey, [place]  02.16.2022 10:41 PM  Karie Chimera, M.D., Ph.D. Diseases & Surgery of the Retina and Vitreous [facility]  I have reviewed the above documentation for accuracy and completeness, and I agree with the above. Karie Chimera, M.D., Ph.D. 08/02/20 10:41 PM   Abbreviations: M myopia (nearsighted); A astigmatism; H hyperopia (farsighted); P presbyopia; Mrx spectacle prescription;  CTL contact lenses; OD right eye; OS left eye; OU both eyes  XT exotropia; ET esotropia; PEK punctate epithelial keratitis; PEE punctate epithelial erosions; DES dry eye syndrome; MGD meibomian gland dysfunction; ATs artificial tears; PFAT's preservative free artificial tears; NSC nuclear sclerotic cataract; PSC posterior subcapsular cataract; ERM epi-retinal membrane; PVD posterior vitreous detachment; RD retinal detachment; DM diabetes mellitus; DR diabetic retinopathy; NPDR non-proliferative diabetic retinopathy; PDR proliferative diabetic retinopathy; CSME clinically significant macular edema; DME diabetic macular edema; dbh dot blot hemorrhages; CWS cotton wool spot; POAG primary open angle glaucoma; C/D cup-to-disc ratio; HVF humphrey visual field; GVF goldmann visual field; OCT optical coherence tomography; IOP intraocular pressure; BRVO Branch retinal vein occlusion; CRVO central retinal vein occlusion; CRAO central retinal artery occlusion; BRAO branch retinal artery occlusion; RT retinal tear; SB scleral buckle; PPV pars plana vitrectomy; VH Vitreous hemorrhage; PRP panretinal laser photocoagulation; IVK intravitreal kenalog; VMT vitreomacular traction; MH Macular hole;  NVD neovascularization of the disc; NVE neovascularization elsewhere; AREDS age related eye disease study; ARMD age related macular degeneration; POAG primary open angle glaucoma; EBMD epithelial/anterior basement membrane dystrophy; ACIOL anterior chamber intraocular lens; IOL intraocular lens; PCIOL posterior chamber intraocular  lens; Phaco/IOL phacoemulsification with intraocular lens placement; PRK photorefractive keratectomy; LASIK laser assisted in situ keratomileusis; HTN hypertension; DM diabetes mellitus; COPD chronic obstructive pulmonary disease

## 2020-08-02 ENCOUNTER — Encounter (INDEPENDENT_AMBULATORY_CARE_PROVIDER_SITE_OTHER): Payer: Self-pay | Admitting: Ophthalmology

## 2020-08-02 ENCOUNTER — Other Ambulatory Visit: Payer: Self-pay

## 2020-08-02 ENCOUNTER — Ambulatory Visit (INDEPENDENT_AMBULATORY_CARE_PROVIDER_SITE_OTHER): Payer: No Typology Code available for payment source | Admitting: Ophthalmology

## 2020-08-02 DIAGNOSIS — H35413 Lattice degeneration of retina, bilateral: Secondary | ICD-10-CM

## 2020-08-02 DIAGNOSIS — H3321 Serous retinal detachment, right eye: Secondary | ICD-10-CM | POA: Diagnosis not present

## 2020-08-02 DIAGNOSIS — H33323 Round hole, bilateral: Secondary | ICD-10-CM

## 2020-08-02 DIAGNOSIS — H3581 Retinal edema: Secondary | ICD-10-CM | POA: Diagnosis not present

## 2020-08-02 MED FILL — BACITRACIN-POLYMYXIN EYE OI: 500-10000 | 7 days supply | Qty: 4 | Fill #1

## 2020-08-03 ENCOUNTER — Encounter (INDEPENDENT_AMBULATORY_CARE_PROVIDER_SITE_OTHER): Payer: Self-pay | Admitting: Ophthalmology

## 2020-08-22 NOTE — Progress Notes (Signed)
[facility] - Clinic Note  08/23/2020     CHIEF COMPLAINT Patient presents for Retina Follow Up   HISTORY OF PRESENT ILLNESS: Savannah Holland is a 35 y.o. female who presents to the clinic today for:   HPI    Retina Follow Up    Patient presents with  Retinal Break/Detachment.  In right eye.  This started 3 weeks ago.  I, the attending physician,  performed the HPI with the patient and updated documentation appropriately.          Comments    Patient here for 3 weeks retina follow up for RD OD. Patient states vision doing pretty good. No eye pain. Had a black floater that is now gone.       Last edited by Rennis Chris, MD on 08/23/2020  1:07 PM. (History)    Pt doing well.  [facility] good OU.  Gas bubble OD not gone, but nearly.  Ready to go back to working more hours.  Referring physician: Reva Bores, MD [address] [place],  [place] [address]  HISTORICAL INFORMATION:   Selected notes from the MEDICAL RECORD NUMBER Referred by Dr. 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 bacitracin-polymyxin b (POLYSPORIN) ophthalmic ointment Place into the right eye 4 (four) times daily. Place a 1/2 inch ribbon of ointment into the lower eyelid.  . prednisoLONE acetate (PRED FORTE) 1 % ophthalmic suspension Place 1 drop into the right eye 4 (four) times daily.   No current facility-administered medications for this visit. (Ophthalmic Drugs)   Current Outpatient Medications (Other)  Medication Sig  . acetaminophen (TYLENOL) 500 MG tablet Take 500 mg by mouth daily as needed (Neck pain).  Marland Kitchen levonorgestrel (MIRENA) 20 MCG/24HR IUD 1 each by Intrauterine route once.  . Vitamin D, Ergocalciferol, (DRISDOL) 1.25 MG (50000 UNIT) CAPS capsule Take 1 capsule (50,000 Units total) by mouth every 7 (seven) days.   No current facility-administered  medications for this visit. (Other)      REVIEW OF SYSTEMS: ROS    Positive for: Neurological, Eyes   Negative for: Constitutional, Gastrointestinal, Skin, Genitourinary, Musculoskeletal, HENT, Endocrine, Cardiovascular, Respiratory, Psychiatric, Allergic/Imm, Heme/Lymph   Last edited by Laddie Aquas, COA on 08/23/2020  9:27 AM. (History)       ALLERGIES No Known Allergies  PAST MEDICAL HISTORY Past Medical History:  Diagnosis Date  . Allergy   . GERD (gastroesophageal reflux disease)   . Pulmonary embolism (HCC) 2018   finished 3 month course of Xarelto   Past Surgical History:  Procedure Laterality Date  . EYE SURGERY Right    Rheg RD repair - Dr. Rennis Chris  . GAS INSERTION Right 06/22/2020   Procedure: INSERTION OF GAS - C3F8;  Surgeon: Rennis Chris, MD;  Location: [facility] OR;  Service: Ophthalmology;  Laterality: Right;  . GAS/FLUID EXCHANGE Right 06/22/2020   Procedure: GAS/FLUID EXCHANGE;  Surgeon: Rennis Chris, MD;  Location: [facility] OR;  Service: Ophthalmology;  Laterality: Right;  . LASER PHOTO ABLATION Left 06/22/2020   Procedure: LASER PHOTO ABLATION;  Surgeon: Rennis Chris, MD;  Location: [facility] OR;  Service: Ophthalmology;  Laterality: Left;  . NO PAST SURGERIES    . PARS PLANA VITRECTOMY Right 06/22/2020   Procedure: PARS PLANA VITRECTOMY WITH 25 GAUGE;  Surgeon: Rennis Chris, MD;  Location: [facility] OR;  Service: Ophthalmology;  Laterality: Right;  .  PHOTOCOAGULATION WITH LASER Right 06/22/2020   Procedure: PHOTOCOAGULATION WITH LASER;  Surgeon: Rennis Chris, MD;  Location: [facility] OR;  Service: Ophthalmology;  Laterality: Right;  . RETINAL DETACHMENT SURGERY Right    Dr. Rennis Chris    FAMILY HISTORY Family History  Problem Relation Age of Onset  . Fibroids Mother   . Hyperlipidemia Father   . GER disease Father   . Prostate cancer Father   . Diabetes Maternal Grandmother   . Diabetes Paternal Grandmother   . Breast cancer Maternal Aunt     SOCIAL HISTORY Social  History   Tobacco Use  . Smoking status: Never Smoker  . Smokeless tobacco: Never Used  Vaping Use  . Vaping Use: Never used  Substance Use Topics  . Alcohol use: Yes    Comment: rare  . Drug use: No         OPHTHALMIC EXAM:  Base Eye Exam    Visual Acuity (Snellen - Linear)      Right Left   Dist cc 20/30 -1 20/25   Dist ph cc 20/20 -2 20/20   Correction: Glasses       Tonometry (Tonopen, 9:24 AM)      Right Left   Pressure 19 16       Pupils      Dark Light Shape React APD   Right 4 3 Round Minimal None   Left 4 3 Round Brisk None       Visual Fields (Counting fingers)      Left Right    Full Full       Extraocular Movement      Right Left    Full, Ortho Full, Ortho       Neuro/Psych    Oriented x3: Yes   Mood/Affect: Normal       Dilation    Both eyes: 1.0% Mydriacyl, 2.5% Phenylephrine @ 9:24 AM        Slit Lamp and Fundus Exam    Slit Lamp Exam      Right Left   Lids/Lashes Normal Normal   Conjunctiva/Sclera sutures dissolving, mild hyperema surrounding dissolving sutures White and quiet   Cornea Clear Clear   Anterior Chamber deep and clear Deep and quiet   Iris Round and dilated Round and dilated   Lens 2+ Posterior subcapsular cataract Clear   Vitreous post vitrectomy; 5% gas bubble  Vitreous syneresis, no pigment       Fundus Exam      Right Left   Disc Pink and Sharp, Tilted disc, temporal PPA Pink and Sharp, mildly Tilted disc, temporal PPA, Compact   C/D Ratio 0.3 0.3   Macula Flat, good foveal reflex, mild RPE mottling, No heme or edema Flat, Good foveal reflex, mild RPE mottling, No heme or edema   Vessels Normal Normal   Periphery retina attached; focal residual pockets of SRF IT periphery; IT retinotomy with good laser changes; good 360 laser; ORIGINALLY: Persistent SRF IT periphery (extending beyond laser barricade); shallow SRF temporal and superior periphery; lattice with round, atrophic holes at 1030 and 1045 within  detachment, pigmented VR tuft at 0730 outside of detachment.  No new RT/RD. Attached, focal pigmented lattice IT quad at 0400, 0430 (with atrophic holes) and 0500 -- good laser in place.  No new RT/RD.        Refraction    Wearing Rx      Sphere Cylinder Axis   Right -8.25 +0.50 055   Left -8.25 +0.50  004       Wearing Rx #2      Sphere Cylinder Axis   Right -7.50 Sphere    Left -7.50 Sphere    Type: CL          IMAGING AND PROCEDURES  Imaging and Procedures for 08/23/2020  OCT, Retina - OU - Both Eyes       Right Eye Quality was good. Central Foveal Thickness: 256. Progression has improved. Findings include normal foveal contour, no IRF, myopic contour, subretinal fluid (Temporal retina re-attached.  Trace focal pocket of shallow SRF IT periphery.).   Left Eye Quality was good. Central Foveal Thickness: 254. Progression has been stable. Findings include normal foveal contour, no IRF, no SRF, vitreomacular adhesion , myopic contour.   Notes *Images captured and stored on drive  Diagnosis / Impression: OD: Temporal retina re-attached.  Trace focal pocket of shallow SRF IT periphery. OS: NFP, no IRF/SRF OS  Clinical management:  See below  Abbreviations: NFP - Normal foveal profile. CME - cystoid macular edema. PED - pigment epithelial detachment. IRF - intraretinal fluid. SRF - subretinal fluid. EZ - ellipsoid zone. ERM - epiretinal membrane. ORA - outer retinal atrophy. ORT - outer retinal tubulation. SRHM - subretinal hyper-reflective material. IRHM - intraretinal hyper-reflective material                  ASSESSMENT/PLAN:    ICD-10-CM   1. Right retinal detachment  H33.21   2. 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 Other secondary cataract of right eye  H26.491     1,2. Rhegmatogenous retinal detachment, OD - detached from 11 to 8 oclock, with  atrophic holes at 1030 and 1045 - s/p pneumatic retinopexy OD (12.13.21) - s/p supplemental barricade laser retinopexy OD 12.20.21  - s/p laser retinopexy w/ indirect ophthalmoscope OD 12.22.21  - s/p supplemental 100% C3F8 gas (0.1 cc), 01.03.22 - pre op exam and OCT with persistent shallow SRF in temporal periphery, greatest 8-9 oclock -- SRF extending beyond laser  - s/p 25g PPV/EL/FAX/14% C3F8 OD, 01.06.22             - doing well -- BCVA 20/20             - retina attached and in good position -- good laser changes  - 5% gas bubble             - IOP 19             - decrease PF to QD OD x 2 wks, then stop.             - can d/c face down positioning; can sleep on either side, avoid laying flat on back   - okay to do computer work, and then clear to resume full work schedule next week (3.14.22)             - post op drop and positioning instructions reviewed  - f/u 4 weeks, RD OD, POV  3,4. Lattice degeneration w/ atrophic holes, both eyes  - OD: lattice with atrophic holes and RD as above - OS: focal pigmented lattice IT at 0400, 0430 and 0500 --0430 with atrophic holes - s/p laser retinopexy OS (01.06.21) good laser changes in place  5. PSC Cataract OD - Not affecting vision now.  Monitor.  Ophthalmic Meds  Ordered this visit:  No orders of the defined types were placed in this encounter.     Return in about 1 month (around 09/23/2020) for 1 mo f/u for RD OD/POV w/DFE OU/OCT.  There are no Patient Instructions on file for this visit.  Explained the diagnoses, plan, and follow up with the patient and they expressed understanding.  Patient expressed understanding of the importance of proper follow up care.   This document serves as a record of services personally performed by Karie Chimera, MD, PhD. It was created on their behalf by Annalee Genta, COMT. The creation of this record is the provider's dictation and/or activities during the visit.  Electronically signed by: Annalee Genta, COMT 08/23/20 1:15 PM   This document serves as a record of services personally performed by Karie Chimera, MD, PhD. It was created on their behalf by Glee Arvin. Manson Passey, OA an ophthalmic technician. The creation of this record is the provider's dictation and/or activities during the visit.    Electronically signed by: Glee Arvin. Manson Passey, [place] 03.09.2022 1:15 PM  This document serves as a record of services personally performed by Karie Chimera, MD, PhD. It was created on their behalf by Cristopher Estimable, COT an ophthalmic technician. The creation of this record is the provider's dictation and/or activities during the visit.    Electronically signed by: Cristopher Estimable, COT 3.9.22 @ 1:15 PM  Karie Chimera, M.D., Ph.D. Diseases & Surgery of the Retina and Vitreous [facility] 3.9.22  I have reviewed the above documentation for accuracy and completeness, and I agree with the above. Karie Chimera, M.D., Ph.D. 08/23/20 1:15 PM  Abbreviations: M myopia (nearsighted); A astigmatism; H hyperopia (farsighted); P presbyopia; Mrx spectacle prescription;  CTL contact lenses; OD right eye; OS left eye; OU both eyes  XT exotropia; ET esotropia; PEK punctate epithelial keratitis; PEE punctate epithelial erosions; DES dry eye syndrome; MGD meibomian gland dysfunction; ATs artificial tears; PFAT's preservative free artificial tears; NSC nuclear sclerotic cataract; PSC posterior subcapsular cataract; ERM epi-retinal membrane; PVD posterior vitreous detachment; RD retinal detachment; DM diabetes mellitus; DR diabetic retinopathy; NPDR non-proliferative diabetic retinopathy; PDR proliferative diabetic retinopathy; CSME clinically significant macular edema; DME diabetic macular edema; dbh dot blot hemorrhages; CWS cotton wool spot; POAG primary open angle glaucoma; C/D cup-to-disc ratio; HVF humphrey visual field; GVF goldmann visual field; OCT optical coherence tomography; IOP intraocular  pressure; BRVO Branch retinal vein occlusion; CRVO central retinal vein occlusion; CRAO central retinal artery occlusion; BRAO branch retinal artery occlusion; RT retinal tear; SB scleral buckle; PPV pars plana vitrectomy; VH Vitreous hemorrhage; PRP panretinal laser photocoagulation; IVK intravitreal kenalog; VMT vitreomacular traction; MH Macular hole;  NVD neovascularization of the disc; NVE neovascularization elsewhere; AREDS age related eye disease study; ARMD age related macular degeneration; POAG primary open angle glaucoma; EBMD epithelial/anterior basement membrane dystrophy; ACIOL anterior chamber intraocular lens; IOL intraocular lens; PCIOL posterior chamber intraocular lens; Phaco/IOL phacoemulsification with intraocular lens placement; PRK photorefractive keratectomy; LASIK laser assisted in situ keratomileusis; HTN hypertension; DM diabetes mellitus; COPD chronic obstructive pulmonary disease

## 2020-08-23 ENCOUNTER — Encounter (INDEPENDENT_AMBULATORY_CARE_PROVIDER_SITE_OTHER): Payer: Self-pay | Admitting: Ophthalmology

## 2020-08-23 ENCOUNTER — Other Ambulatory Visit: Payer: Self-pay

## 2020-08-23 ENCOUNTER — Ambulatory Visit (INDEPENDENT_AMBULATORY_CARE_PROVIDER_SITE_OTHER): Payer: No Typology Code available for payment source | Admitting: Ophthalmology

## 2020-08-23 DIAGNOSIS — H3321 Serous retinal detachment, right eye: Secondary | ICD-10-CM

## 2020-08-23 DIAGNOSIS — H26491 Other secondary cataract, right eye: Secondary | ICD-10-CM

## 2020-08-23 DIAGNOSIS — H35413 Lattice degeneration of retina, bilateral: Secondary | ICD-10-CM

## 2020-08-23 DIAGNOSIS — H3581 Retinal edema: Secondary | ICD-10-CM

## 2020-08-23 DIAGNOSIS — H33323 Round hole, bilateral: Secondary | ICD-10-CM | POA: Diagnosis not present

## 2020-08-24 ENCOUNTER — Ambulatory Visit (INDEPENDENT_AMBULATORY_CARE_PROVIDER_SITE_OTHER): Payer: No Typology Code available for payment source | Admitting: Family Medicine

## 2020-08-24 ENCOUNTER — Other Ambulatory Visit (HOSPITAL_COMMUNITY)
Admission: RE | Admit: 2020-08-24 | Discharge: 2020-08-24 | Disposition: A | Discharge status: Home / Self Care | Payer: No Typology Code available for payment source | Source: Ambulatory Visit | Attending: Family Medicine | Admitting: Family Medicine

## 2020-08-24 ENCOUNTER — Encounter: Payer: Self-pay | Admitting: Family Medicine

## 2020-08-24 VITALS — BP 124/83 | HR 85 | Ht 64.5 in | Wt 248.0 lb

## 2020-08-24 DIAGNOSIS — Z01419 Encounter for gynecological examination (general) (routine) without abnormal findings: Secondary | ICD-10-CM

## 2020-08-24 DIAGNOSIS — Z113 Encounter for screening for infections with a predominantly sexual mode of transmission: Secondary | ICD-10-CM | POA: Insufficient documentation

## 2020-08-24 DIAGNOSIS — E559 Vitamin D deficiency, unspecified: Secondary | ICD-10-CM

## 2020-08-24 NOTE — Patient Instructions (Signed)
Preventive Care 11-36 Years Old, Female Preventive care refers to lifestyle choices and visits with your health care provider that can promote health and wellness. This includes:  A yearly physical exam. This is also called an annual wellness visit.  Regular dental and eye exams.  Immunizations.  Screening for certain conditions.  Healthy lifestyle choices, such as: ? Eating a healthy diet. ? Getting regular exercise. ? Not using drugs or products that contain nicotine and tobacco. ? Limiting alcohol use. What can I expect for my preventive care visit? Physical exam Your health care provider may check your:  Height and weight. These may be used to calculate your BMI (body mass index). BMI is a measurement that tells if you are at a healthy weight.  Heart rate and blood pressure.  Body temperature.  Skin for abnormal spots. Counseling Your health care provider may ask you questions about your:  Past medical problems.  Family's medical history.  Alcohol, tobacco, and drug use.  Emotional well-being.  Home life and relationship well-being.  Sexual activity.  Diet, exercise, and sleep habits.  Work and work Statistician.  Access to firearms.  Method of birth control.  Menstrual cycle.  Pregnancy history. What immunizations do I need? Vaccines are usually given at various ages, according to a schedule. Your health care provider will recommend vaccines for you based on your age, medical history, and lifestyle or other factors, such as travel or where you work.   What tests do I need? Blood tests  Lipid and cholesterol levels. These may be checked every 5 years starting at age 64.  Hepatitis C test.  Hepatitis B test. Screening  Diabetes screening. This is done by checking your blood sugar (glucose) after you have not eaten for a while (fasting).  STD (sexually transmitted disease) testing, if you are at risk.  BRCA-related cancer screening. This may  be done if you have a family history of breast, ovarian, tubal, or peritoneal cancers.  Pelvic exam and Pap test. This may be done every 3 years starting at age 61. Starting at age 82, this may be done every 5 years if you have a Pap test in combination with an HPV test. Talk with your health care provider about your test results, treatment options, and if necessary, the need for more tests.   Follow these instructions at home: Eating and drinking  Eat a healthy diet that includes fresh fruits and vegetables, whole grains, lean protein, and low-fat dairy products.  Take vitamin and mineral supplements as recommended by your health care provider.  Do not drink alcohol if: ? Your health care provider tells you not to drink. ? You are pregnant, may be pregnant, or are planning to become pregnant.  If you drink alcohol: ? Limit how much you have to 0-1 drink a day. ? Be aware of how much alcohol is in your drink. In the [place] one drink equals one 12 oz bottle of beer (355 mL), one 5 oz glass of wine (148 mL), or one 1 oz glass of hard liquor (44 mL).   Lifestyle  Take daily care of your teeth and gums. Brush your teeth every morning and night with fluoride toothpaste. Floss one time each day.  Stay active. Exercise for at least 30 minutes 5 or more days each week.  Do not use any products that contain nicotine or tobacco, such as cigarettes, e-cigarettes, and chewing tobacco. If you need help quitting, ask your health care provider.  Do  not use drugs.  If you are sexually active, practice safe sex. Use a condom or other form of protection to prevent STIs (sexually transmitted infections).  If you do not wish to become pregnant, use a form of birth control. If you plan to become pregnant, see your health care provider for a prepregnancy visit.  Find healthy ways to cope with stress, such as: ? Meditation, yoga, or listening to music. ? Journaling. ? Talking to a trusted  person. ? Spending time with friends and family. Safety  Always wear your seat belt while driving or riding in a vehicle.  Do not drive: ? If you have been drinking alcohol. Do not ride with someone who has been drinking. ? When you are tired or distracted. ? While texting.  Wear a helmet and other protective equipment during sports activities.  If you have firearms in your house, make sure you follow all gun safety procedures.  Seek help if you have been physically or sexually abused. What's next?  Go to your health care provider once a year for an annual wellness visit.  Ask your health care provider how often you should have your eyes and teeth checked.  Stay up to date on all vaccines. This information is not intended to replace advice given to you by your health care provider. Make sure you discuss any questions you have with your health care provider. Document Revised: 01/30/2020 Document Reviewed: 02/12/2018 Elsevier Patient Education  2021 [employer].

## 2020-08-24 NOTE — Progress Notes (Signed)
  Subjective:     Savannah Holland is a 35 y.o. female and is here for a comprehensive physical exam. The patient reports no problems. Has IUD in since 2018. Light cycles. Interested in permanent contraception. Has been out of work x 3 months with her retinal detachment and inability to drive. She has put on some weight.   The following portions of the patient's history were reviewed and updated as appropriate: allergies, current medications, past family history, past medical history, past social history, past surgical history and problem list.  Review of Systems Pertinent items noted in HPI and remainder of comprehensive ROS otherwise negative.   Objective:    BP 124/83   Pulse 85   Ht 5' 4.5" (1.638 m)   Wt 248 lb (112.5 kg)   LMP 07/26/2020 (Approximate)   BMI 41.91 kg/m  General appearance: alert, cooperative and appears stated age Head: Normocephalic, without obvious abnormality, atraumatic Neck: no adenopathy, supple, symmetrical, trachea midline and thyroid not enlarged, symmetric, no tenderness/mass/nodules Lungs: clear to auscultation bilaterally Breasts: normal appearance, no masses or tenderness Heart: regular rate and rhythm, S1, S2 normal, no murmur, click, rub or gallop Abdomen: soft, non-tender; bowel sounds normal; no masses,  no organomegaly Extremities: Homans sign is negative, no sign of DVT Skin: Skin color, texture, turgor normal. No rashes or lesions Neurologic: Grossly normal    Assessment:    Healthy female exam.      Plan:      Problem List Items Addressed This Visit      Unprioritized   Vitamin D deficiency   Relevant Orders   VITAMIN D 25 Hydroxy (Vit-D Deficiency, Fractures)    Other Visit Diagnoses    Encounter for gynecological examination without abnormal finding    -  Primary   Relevant Orders   CBC   TSH   Comprehensive metabolic panel   Hemoglobin A1c   Lipid panel   Screen for STD (sexually transmitted disease)       Relevant  Orders   GC/Chlamydia probe amp ([place])not at [facility]   Hepatitis B surface antigen   Hepatitis C antibody   HIV Antibody (routine testing w rflx)   RPR     Return in 1 year (on 08/24/2021).  See After Visit Summary for Counseling Recommendations

## 2020-08-24 NOTE — Progress Notes (Signed)
Mirena placed in Jan of 2018 Will wait to next year for pap

## 2020-08-25 DIAGNOSIS — R945 Abnormal results of liver function studies: Secondary | ICD-10-CM

## 2020-08-25 DIAGNOSIS — R7989 Other specified abnormal findings of blood chemistry: Secondary | ICD-10-CM

## 2020-08-25 LAB — HIV ANTIBODY (ROUTINE TESTING W REFLEX): HIV Screen 4th Generation wRfx: NONREACTIVE

## 2020-08-25 LAB — COMPREHENSIVE METABOLIC PANEL
ALT: 57 IU/L — ABNORMAL HIGH (ref 0–32)
AST: 41 IU/L — ABNORMAL HIGH (ref 0–40)
Albumin/Globulin Ratio: 1.3 (ref 1.2–2.2)
Albumin: 4.4 g/dL (ref 3.8–4.8)
Alkaline Phosphatase: 124 IU/L — ABNORMAL HIGH (ref 44–121)
BUN/Creatinine Ratio: 13 (ref 9–23)
BUN: 12 mg/dL (ref 6–20)
Bilirubin Total: 0.6 mg/dL (ref 0.0–1.2)
CO2: 21 mmol/L (ref 20–29)
Calcium: 9.6 mg/dL (ref 8.7–10.2)
Chloride: 101 mmol/L (ref 96–106)
Creatinine, Ser: 0.92 mg/dL (ref 0.57–1.00)
Globulin, Total: 3.5 g/dL (ref 1.5–4.5)
Glucose: 83 mg/dL (ref 65–99)
Potassium: 4.4 mmol/L (ref 3.5–5.2)
Sodium: 138 mmol/L (ref 134–144)
Total Protein: 7.9 g/dL (ref 6.0–8.5)
eGFR: 83 mL/min/{1.73_m2} (ref >59)

## 2020-08-25 LAB — LIPID PANEL
Chol/HDL Ratio: 3.8 ratio (ref 0.0–4.4)
Cholesterol, Total: 210 mg/dL — ABNORMAL HIGH (ref 100–199)
HDL: 56 mg/dL (ref >39)
LDL Chol Calc (NIH): 135 mg/dL — ABNORMAL HIGH (ref 0–99)
Triglycerides: 108 mg/dL (ref 0–149)
VLDL Cholesterol Cal: 19 mg/dL (ref 5–40)

## 2020-08-25 LAB — GC/CHLAMYDIA PROBE AMP (~~LOC~~) NOT AT ARMC
Chlamydia: NEGATIVE
Comment: NEGATIVE
Comment: NORMAL
Neisseria Gonorrhea: NEGATIVE

## 2020-08-25 LAB — HEPATITIS C ANTIBODY: Hep C Virus Ab: <0.1 s/co ratio (ref 0.0–0.9)

## 2020-08-25 LAB — RPR: RPR Ser Ql: NONREACTIVE

## 2020-08-25 LAB — CBC
Hematocrit: 41.9 % (ref 34.0–46.6)
Hemoglobin: 13.2 g/dL (ref 11.1–15.9)
MCH: 25.7 pg — ABNORMAL LOW (ref 26.6–33.0)
MCHC: 31.5 g/dL (ref 31.5–35.7)
MCV: 82 fL (ref 79–97)
Platelets: 276 10*3/uL (ref 150–450)
RBC: 5.14 x10E6/uL (ref 3.77–5.28)
RDW: 17.6 % — ABNORMAL HIGH (ref 11.7–15.4)
WBC: 5.6 10*3/uL (ref 3.4–10.8)

## 2020-08-25 LAB — HEMOGLOBIN A1C
Est. average glucose Bld gHb Est-mCnc: 108 mg/dL
Hgb A1c MFr Bld: 5.4 % (ref 4.8–5.6)

## 2020-08-25 LAB — TSH: TSH: 1.07 u[IU]/mL (ref 0.450–4.500)

## 2020-08-25 LAB — VITAMIN D 25 HYDROXY (VIT D DEFICIENCY, FRACTURES): Vit D, 25-Hydroxy: 32.6 ng/mL (ref 30.0–100.0)

## 2020-08-25 LAB — HEPATITIS B SURFACE ANTIGEN: Hepatitis B Surface Ag: NEGATIVE

## 2020-09-19 NOTE — Progress Notes (Signed)
[facility] - Clinic Note  09/20/2020     CHIEF COMPLAINT Patient presents for Post-op Follow-up   HISTORY OF PRESENT ILLNESS: Savannah Holland is a 35 y.o. female who presents to the clinic today for:   HPI    Post-op Follow-up    In right eye.  Discomfort includes floaters.  Negative for pain, itching, foreign body sensation, tearing, discharge and none.  Vision is improved.  I, the attending physician,  performed the HPI with the patient and updated documentation appropriately.          Comments    Pt states her vision is doing well, she states when she spends a long time on the computer, she sees floaters, but if she takes a break, they seem to go away, she is wearing her CL again and feels like her right eye is more blurry now than her left eye, but she only notices when she closes her left eye, she is off all drops and back to work full time       Last edited by Posey BoyerBrown, Amanda J, COT on 09/20/2020  9:37 AM. (History)    Pt   Referring physician: Reva BoresPratt, Tanya S, MD [address] [place] [id number]  HISTORICAL INFORMATION:   Selected notes from the MEDICAL RECORD NUMBER Referred by Dr. 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 neomycin-polymyxin b-dexamethasone (MAXITROL) 3.5-10000-0.1 OINT PLACE 1 APPLICATION INTO THE RIGHT EYE 4 (FOUR) TIMES DAILY.  Marland Kitchen. prednisoLONE acetate (PRED FORTE) 1 % ophthalmic suspension PLACE 1 DROP INTO THE RIGHT EYE 4 (FOUR) TIMES DAILY. (Patient taking differently: Place 1 drop into the right eye once. One drop daily)  . prednisoLONE acetate (PRED FORTE) 1 % ophthalmic suspension PLACE 1 DROP INTO THE RIGHT EYE 4 (FOUR) TIMES DAILY FOR 7 DAYS.   No current facility-administered medications for this visit. (Ophthalmic Drugs)   Current Outpatient Medications (Other)  Medication Sig  .  acetaminophen (TYLENOL) 500 MG tablet Take 500 mg by mouth daily as needed (Neck pain).  . Biotin 5284110000 MCG TABS Take by mouth.  . levonorgestrel (MIRENA) 20 MCG/24HR IUD 1 each by Intrauterine route once.   No current facility-administered medications for this visit. (Other)      REVIEW OF SYSTEMS: ROS    Positive for: Eyes   Negative for: Constitutional, Gastrointestinal, Neurological, Skin, Genitourinary, Musculoskeletal, HENT, Endocrine, Cardiovascular, Respiratory, Psychiatric, Allergic/Imm, Heme/Lymph   Last edited by Posey BoyerBrown, Amanda J, COT on 09/20/2020  9:32 AM. (History)       ALLERGIES No Known Allergies  PAST MEDICAL HISTORY Past Medical History:  Diagnosis Date  . Allergy   . GERD (gastroesophageal reflux disease)   . Pulmonary embolism (HCC) 2018   finished 3 month course of Xarelto   Past Surgical History:  Procedure Laterality Date  . EYE SURGERY Right    Rheg RD repair - Dr. Rennis ChrisBrian Simar Pothier  . GAS INSERTION Right 06/22/2020   Procedure: INSERTION OF GAS - C3F8;  Surgeon: Rennis ChrisZamora, Deshunda Thackston, MD;  [facility];  Service: Ophthalmology;  Laterality: Right;  . GAS/FLUID EXCHANGE Right 06/22/2020   Procedure: GAS/FLUID EXCHANGE;  Surgeon: Rennis ChrisZamora, Paxson Harrower, MD;  [facility];  Service: Ophthalmology;  Laterality: Right;  . LASER PHOTO ABLATION Left 06/22/2020   Procedure: LASER PHOTO ABLATION;  Surgeon: Rennis ChrisZamora, Otniel Hoe, MD;  [facility];  Service: Ophthalmology;  Laterality: Left;  . NO PAST SURGERIES    . PARS PLANA VITRECTOMY Right 06/22/2020   Procedure: PARS PLANA VITRECTOMY WITH 25 GAUGE;  Surgeon: Rennis Chris, MD;  Location: [facility] OR;  Service: Ophthalmology;  Laterality: Right;  . PHOTOCOAGULATION WITH LASER Right 06/22/2020   Procedure: PHOTOCOAGULATION WITH LASER;  Surgeon: Rennis Chris, MD;  Location: [facility] OR;  Service: Ophthalmology;  Laterality: Right;  . RETINAL DETACHMENT SURGERY Right    Dr. Rennis Chris    FAMILY HISTORY Family History  Problem Relation Age of  Onset  . Fibroids Mother   . Hyperlipidemia Father   . GER disease Father   . Prostate cancer Father   . Diabetes Maternal Grandmother   . Diabetes Paternal Grandmother   . Breast cancer Maternal Aunt     SOCIAL HISTORY Social History   Tobacco Use  . Smoking status: Never Smoker  . Smokeless tobacco: Never Used  Vaping Use  . Vaping Use: Never used  Substance Use Topics  . Alcohol use: Yes    Comment: rare  . Drug use: No         OPHTHALMIC EXAM:  Base Eye Exam    Visual Acuity (Snellen - Linear)      Right Left   Dist cc 20/25 -2 20/20   Dist ph cc 20/25 +2    Correction: Glasses       Tonometry (Tonopen, 9:23 AM)      Right Left   Pressure 19 15       Pupils      Dark Light Shape React APD   Right 5 4 Round Brisk None   Left 5 3 Round Brisk None       Visual Fields (Counting fingers)      Left Right    Full Full       Extraocular Movement      Right Left    Full, Ortho Full, Ortho       Neuro/Psych    Oriented x3: Yes   Mood/Affect: Normal       Dilation    Both eyes: 1.0% Mydriacyl, 2.5% Phenylephrine @ 9:23 AM        Slit Lamp and Fundus Exam    Slit Lamp Exam      Right Left   Lids/Lashes Normal Normal   Conjunctiva/Sclera White and quiet White and quiet   Cornea trace inferior PEE 1+ Punctate epithelial erosions   Anterior Chamber deep and clear Deep and quiet   Iris Round and dilated Round and dilated   Lens 2+ Posterior subcapsular cataract Clear   Vitreous post vitrectomy; gas bubble gone, clear Vitreous syneresis, no pigment       Fundus Exam      Right Left   Disc Pink and Sharp, Tilted disc, temporal PPA Pink and Sharp, mildly Tilted disc, temporal PPA, Compact   C/D Ratio 0.3 0.3   Macula Flat, good foveal reflex, mild RPE mottling, No heme or edema Flat, Good foveal reflex, mild RPE mottling, No heme or edema   Vessels Normal Normal   Periphery retina attached; focal residual pockets of SRF IT periphery - improved;  IT retinotomy with good laser changes; good 360 laser; ORIGINALLY: Persistent SRF IT periphery (extending beyond laser barricade); shallow SRF temporal and superior periphery; lattice with round, atrophic holes at 1030 and 1045 within detachment, pigmented VR tuft at 0730 outside of detachment.  No new RT/RD. Attached, focal pigmented lattice IT quad at 0400, 0430 (with  atrophic holes) and 0500 -- good laser in place, No new RT/RD/lattice          IMAGING AND PROCEDURES  Imaging and Procedures for 09/20/2020  OCT, Retina - OU - Both Eyes       Right Eye Quality was good. Central Foveal Thickness: 262. Progression has been stable. Findings include normal foveal contour, no IRF, myopic contour, no SRF (Temporal retina re-attached.).   Left Eye Quality was good. Central Foveal Thickness: 252. Progression has been stable. Findings include normal foveal contour, no IRF, no SRF, vitreomacular adhesion , myopic contour.   Notes *Images captured and stored on drive  Diagnosis / Impression: OD: Temporal retina re-attached. OS: NFP, no IRF/SRF OS  Clinical management:  See below  Abbreviations: NFP - Normal foveal profile. CME - cystoid macular edema. PED - pigment epithelial detachment. IRF - intraretinal fluid. SRF - subretinal fluid. EZ - ellipsoid zone. ERM - epiretinal membrane. ORA - outer retinal atrophy. ORT - outer retinal tubulation. SRHM - subretinal hyper-reflective material. IRHM - intraretinal hyper-reflective material                  ASSESSMENT/PLAN:    ICD-10-CM   1. Right retinal detachment  H33.21   2. 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 Other secondary cataract of right eye  H26.491     1,2. Rhegmatogenous retinal detachment, OD - detached from 11 to 8 oclock, with atrophic holes at 1030 and 1045 - s/p pneumatic retinopexy OD (12.13.21) - s/p  supplemental barricade laser retinopexy OD 12.20.21  - s/p laser retinopexy w/ indirect ophthalmoscope OD 12.22.21  - s/p supplemental 100% C3F8 gas (0.1 cc), 01.03.22 - pre op exam and OCT with persistent shallow SRF in temporal periphery, greatest 8-9 oclock -- SRF extending beyond laser  - s/p 25g PPV/EL/FAX/14% C3F8 OD, 01.06.22             - doing well -- BCVA 20/25             - retina attached and in good position -- good laser changes  - gas bubble gone             - IOP 19             - pt back to work full time  - f/u 3-4 months, RD OD, POV  3,4. Lattice degeneration w/ atrophic holes, both eyes  - OD: lattice with atrophic holes and RD as above - OS: focal pigmented lattice IT at 0400, 0430 and 0500 --0430 with atrophic holes - s/p laser retinopexy OS (01.06.21) with good laser changes in place  5. PSC Cataract OD - Not affecting vision now - Monitor  Ophthalmic Meds Ordered this visit:  No orders of the defined types were placed in this encounter.     Return for f/u 3-4 months, RD OD, DFE, OCT.  There are no Patient Instructions on file for this visit.  Explained the diagnoses, plan, and follow up with the patient and they expressed understanding.  Patient expressed understanding of the importance of proper follow up care.   This document serves as a record of services personally performed by Karie Chimera, MD, PhD. It was created on their behalf by Annalee Genta, COMT. The creation of this record is the provider's dictation and/or activities during the visit.  Electronically signed by: Annalee Genta, COMT 09/21/20 3:11 PM   This document serves as a record of services personally performed by Karie Chimera, MD, PhD. It was created on their behalf by Glee Arvin. Manson Passey, OA an ophthalmic technician. The creation of this record is the provider's dictation and/or activities during the visit.    Electronically signed by: Glee Arvin. Manson Passey, [place] 04.06.2022 3:11 PM  Karie Chimera, M.D., Ph.D. Diseases & Surgery of the Retina and Vitreous [facility]  I have reviewed the above documentation for accuracy and completeness, and I agree with the above. Karie Chimera, M.D., Ph.D. 09/21/20 3:11 PM  Abbreviations: M myopia (nearsighted); A astigmatism; H hyperopia (farsighted); P presbyopia; Mrx spectacle prescription;  CTL contact lenses; OD right eye; OS left eye; OU both eyes  XT exotropia; ET esotropia; PEK punctate epithelial keratitis; PEE punctate epithelial erosions; DES dry eye syndrome; MGD meibomian gland dysfunction; ATs artificial tears; PFAT's preservative free artificial tears; NSC nuclear sclerotic cataract; PSC posterior subcapsular cataract; ERM epi-retinal membrane; PVD posterior vitreous detachment; RD retinal detachment; DM diabetes mellitus; DR diabetic retinopathy; NPDR non-proliferative diabetic retinopathy; PDR proliferative diabetic retinopathy; CSME clinically significant macular edema; DME diabetic macular edema; dbh dot blot hemorrhages; CWS cotton wool spot; POAG primary open angle glaucoma; C/D cup-to-disc ratio; HVF humphrey visual field; GVF goldmann visual field; OCT optical coherence tomography; IOP intraocular pressure; BRVO Branch retinal vein occlusion; CRVO central retinal vein occlusion; CRAO central retinal artery occlusion; BRAO branch retinal artery occlusion; RT retinal tear; SB scleral buckle; PPV pars plana vitrectomy; VH Vitreous hemorrhage; PRP panretinal laser photocoagulation; IVK intravitreal kenalog; VMT vitreomacular traction; MH Macular hole;  NVD neovascularization of the disc; NVE neovascularization elsewhere; AREDS age related eye disease study; ARMD age related macular degeneration; POAG primary open angle glaucoma; EBMD epithelial/anterior basement membrane dystrophy; ACIOL anterior chamber intraocular lens; IOL intraocular lens; PCIOL posterior chamber intraocular lens; Phaco/IOL phacoemulsification  with intraocular lens placement; PRK photorefractive keratectomy; LASIK laser assisted in situ keratomileusis; HTN hypertension; DM diabetes mellitus; COPD chronic obstructive pulmonary disease

## 2020-09-20 ENCOUNTER — Other Ambulatory Visit: Payer: Self-pay

## 2020-09-20 ENCOUNTER — Ambulatory Visit (INDEPENDENT_AMBULATORY_CARE_PROVIDER_SITE_OTHER): Payer: No Typology Code available for payment source | Admitting: Ophthalmology

## 2020-09-20 ENCOUNTER — Encounter (INDEPENDENT_AMBULATORY_CARE_PROVIDER_SITE_OTHER): Payer: Self-pay | Admitting: Ophthalmology

## 2020-09-20 DIAGNOSIS — H3321 Serous retinal detachment, right eye: Secondary | ICD-10-CM | POA: Diagnosis not present

## 2020-09-20 DIAGNOSIS — H35413 Lattice degeneration of retina, bilateral: Secondary | ICD-10-CM

## 2020-09-20 DIAGNOSIS — H3581 Retinal edema: Secondary | ICD-10-CM

## 2020-09-20 DIAGNOSIS — H26491 Other secondary cataract, right eye: Secondary | ICD-10-CM

## 2020-09-20 DIAGNOSIS — H33323 Round hole, bilateral: Secondary | ICD-10-CM | POA: Diagnosis not present

## 2020-12-27 IMAGING — MG DIGITAL DIAGNOSTIC BILAT W/ TOMO W/ CAD
6 of 10 series · 6 of 30 positions shown · non-contrast
Comparison: None.

CLINICAL DATA: 33-year-old female presenting for evaluation of a
palpable lump in the right breast, with a sensation of heaviness.
This started about 10 days ago. Shortly after she noticed palpable
lump, she noted a small amount of spontaneous clear nipple
discharge, which stopped about 7-8 days ago.Starting about 5 days
ago, the lump has slowly decreased in size, but is still palpable.

EXAM:
DIGITAL DIAGNOSTIC BILATERAL MAMMOGRAM WITH CAD AND TOMO
RIGHT BREAST ULTRASOUND

[R MLO synth-2D]
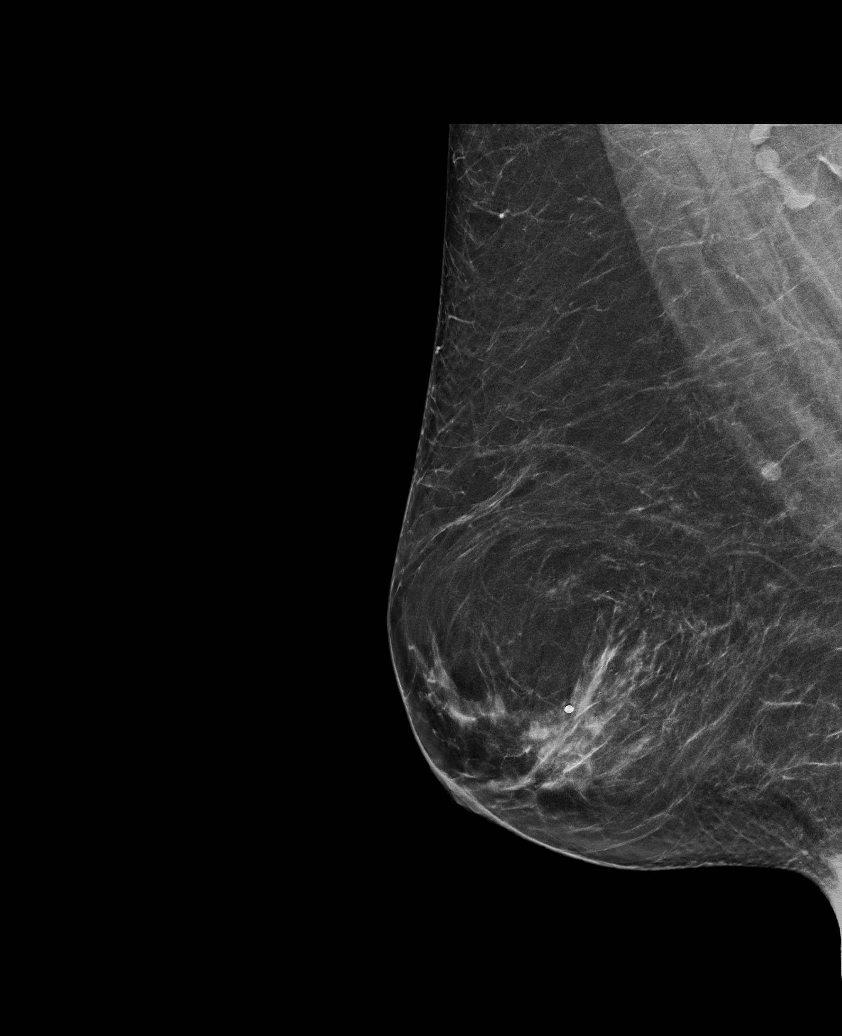

[R CC synth-2D]
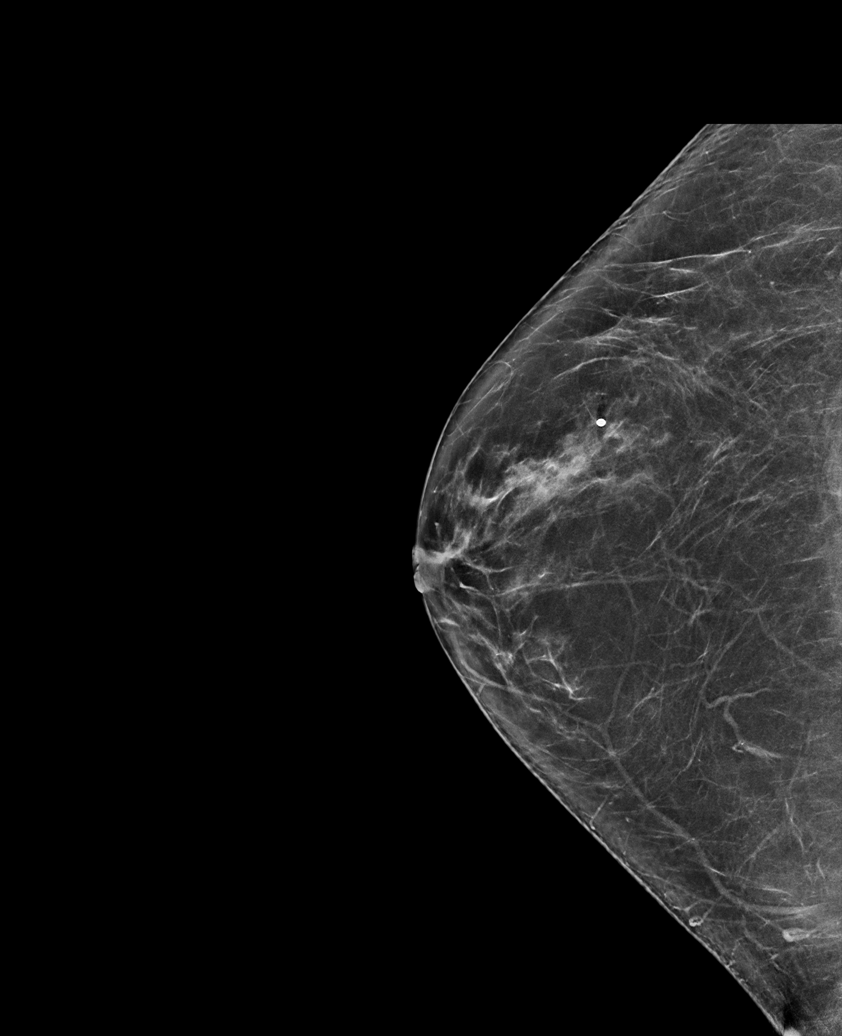

[L CC synth-2D]
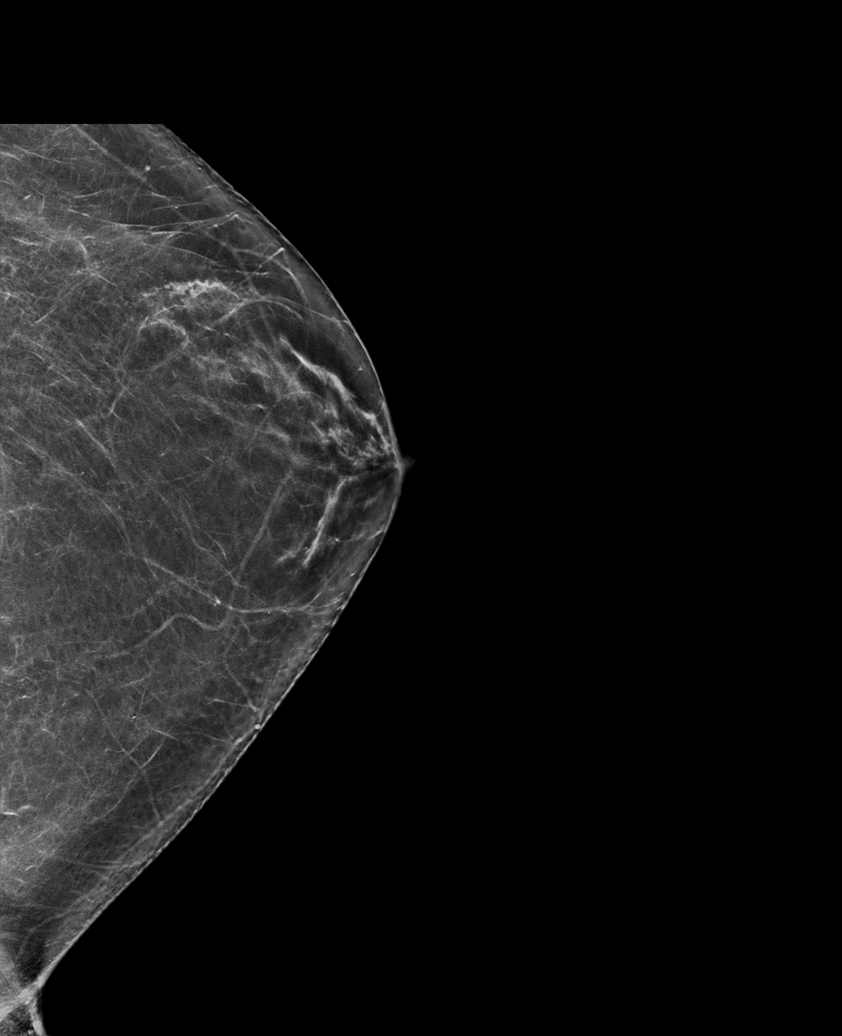

[R TAN synth-2D]
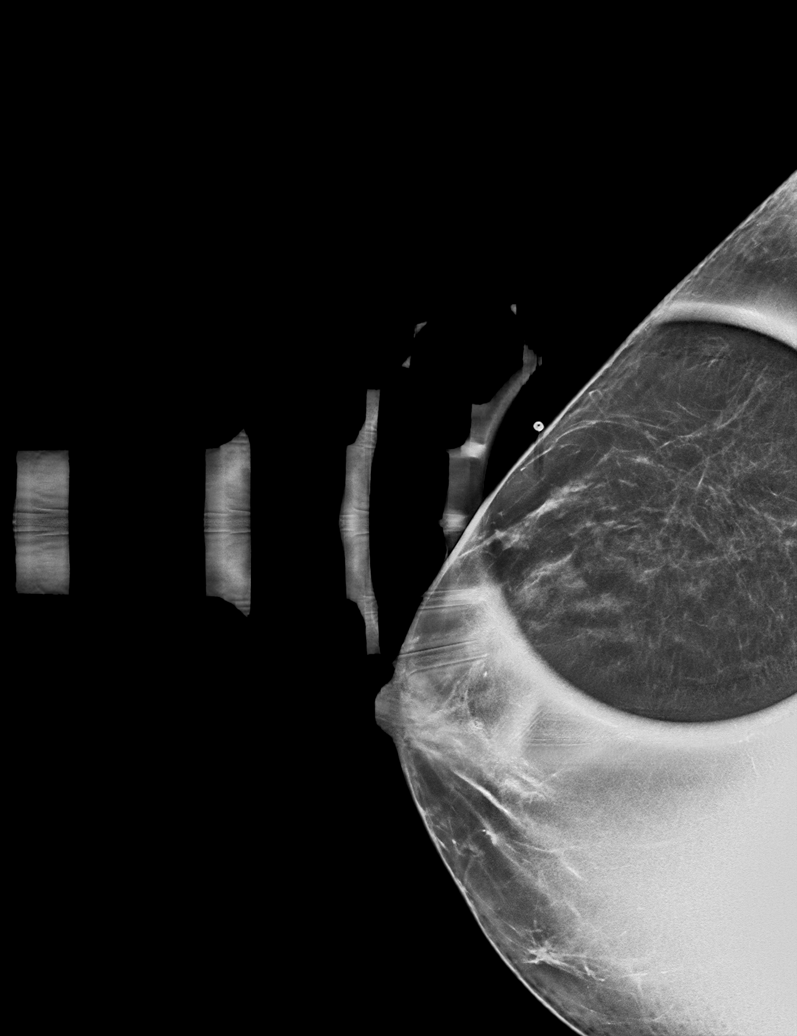

[L MLO synth-2D]
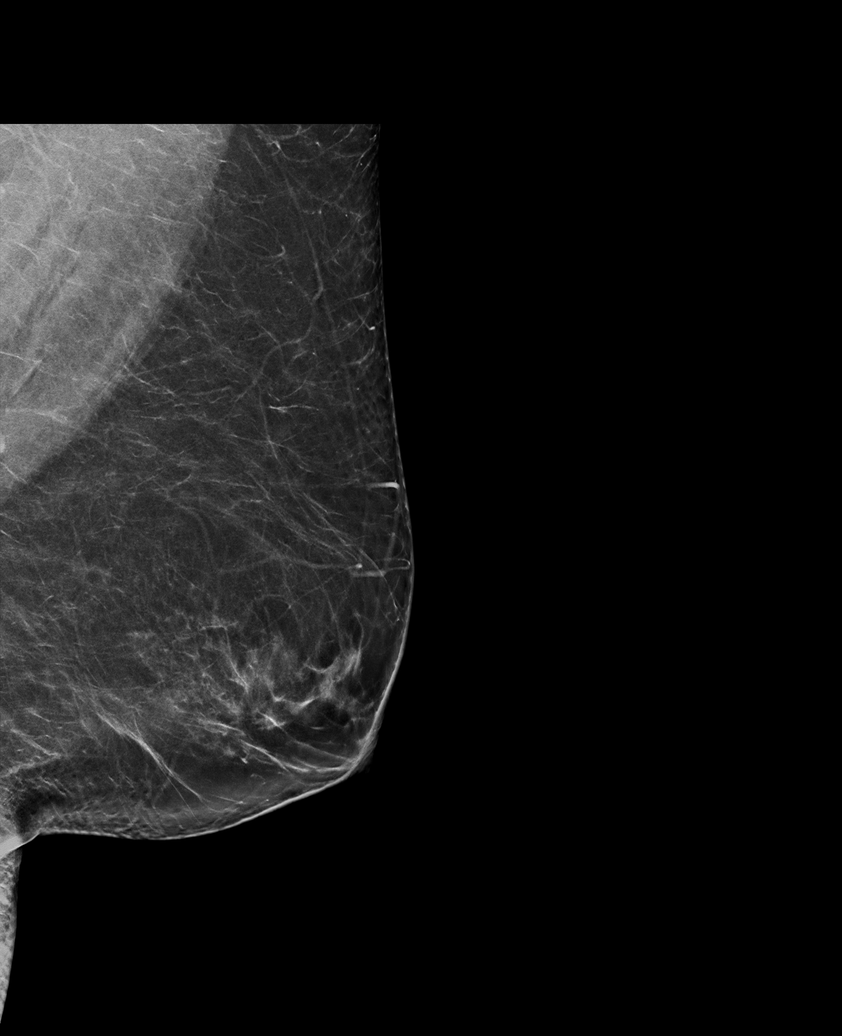

[R TAN tomo · tomo slice 27/53.0]
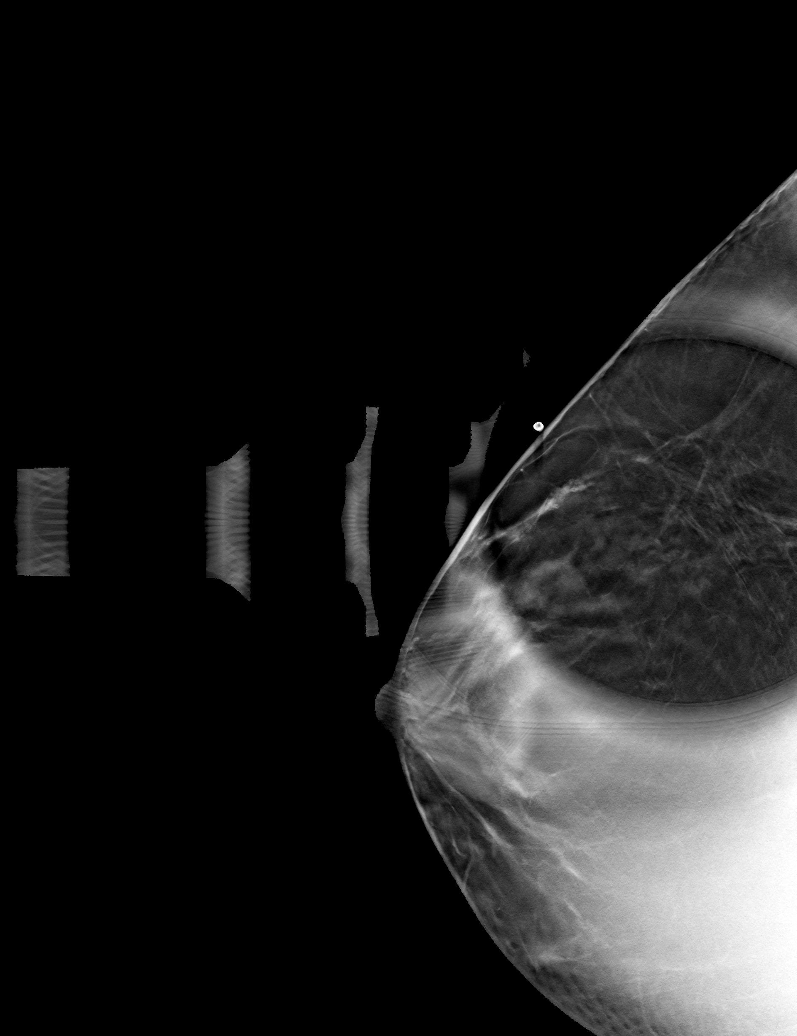

[6 of 30 positions shown; findings below may reference images not displayed]

ACR Breast Density Category b: There are scattered areas of
fibroglandular density.
FINDINGS: A BB has been placed along the lateral aspect of the right breast.
Deep to the palpable marker, there is a focal asymmetry measuring
approximately 4.5 cm. No underlying masses or suspicious post or
shin is identified. No suspicious calcifications, masses or areas of
distortion are seen in the bilateral breasts.

Mammographic images were processed with CAD.

On physical exam, the right nipple is inverted, which the patient
states is chronic. There is a firm ridge of tissue at the palpable
site just lateral to the right nipple.

Ultrasound targeted to the palpable area between 8 and 9 o'clock,
there are multiple mildly dilated ducts filled with mobile debris.
No intraductal or parenchymal masses are identified. Ultrasound of
the right axilla demonstrates normal-appearing lymph nodes.
IMPRESSION: 1. There is an asymmetry at the palpable site in the right breast
which corresponds with dilated fluid-filled ducts on ultrasound.
Given that the patient's symptoms are improving, this may represent
a self-limited process, possibly infection/inflammation.

2.  No finding suspicious for malignancy in the bilateral breasts.

RECOMMENDATION:
I recommended that the patient discuss the possibility of a trial of
antibiotics, however monitoring for the time being is reasonable
given that the patient's symptoms are improving. For imaging, I
recommend a one-month follow-up mammogram and possible ultrasound.

I have discussed the findings and recommendations with the patient.
If applicable, a reminder letter will be sent to the patient
regarding the next appointment.

BI-RADS CATEGORY  3: Probably benign.

## 2021-01-01 NOTE — Progress Notes (Signed)
[facility] - Clinic Note  01/02/2021     CHIEF COMPLAINT Patient presents for Retina Follow Up   HISTORY OF PRESENT ILLNESS: Savannah Holland is a 35 y.o. female who presents to the clinic today for:   HPI     Retina Follow Up   Patient presents with  Retinal Break/Detachment.  In right eye.  Duration of 3 months.  Since onset it is resolved.  I, the attending physician,  performed the HPI with the patient and updated documentation appropriately.        Comments   3 month follow up h/o RD repair OD - Doing well.  Still having cloudiness from cataract OD but no worse.  She still has floaters OD but no increase or FOLs.  Mostly only sees when working on the computer.  She keeps her lights off in her office and wears blue glasses.       Last edited by Rennis Chris, MD on 01/02/2021 12:46 PM.    Pt states she is not having any vision problems other than floaters, she states they are not consistent and don't bother her that much, she states she is on the computer at work more now than she was, but she has them even when she is not on the computer  Referring physician: Reva Bores, MD [address] [place],  [place] [address]  HISTORICAL INFORMATION:   Selected notes from the MEDICAL RECORD NUMBER Referred by Dr. 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 (Patient not taking: Reported on 01/02/2021)   prednisoLONE acetate (PRED FORTE) 1 % ophthalmic suspension PLACE 1 DROP INTO THE RIGHT EYE 4 (FOUR) TIMES DAILY. (Patient not taking: Reported on 01/02/2021)   prednisoLONE acetate (PRED FORTE) 1 % ophthalmic suspension PLACE 1 DROP INTO THE RIGHT EYE 4 (FOUR) TIMES DAILY FOR 7 DAYS. (Patient not taking: Reported on  01/02/2021)   No current facility-administered medications for this visit. (Ophthalmic Drugs)   Current Outpatient Medications (Other)  Medication Sig   Biotin 24825 MCG TABS Take by mouth.   levonorgestrel (MIRENA) 20 MCG/24HR IUD 1 each by Intrauterine route once.   acetaminophen (TYLENOL) 500 MG tablet Take 500 mg by mouth daily as needed (Neck pain). (Patient not taking: Reported on 01/02/2021)   No current facility-administered medications for this visit. (Other)      REVIEW OF SYSTEMS: ROS   Positive for: Eyes Negative for: Constitutional, Gastrointestinal, Neurological, Skin, Genitourinary, Musculoskeletal, HENT, Endocrine, Cardiovascular, Respiratory, Psychiatric, Allergic/Imm, Heme/Lymph Last edited by Joni Reining, COA on 01/02/2021  9:25 AM.        ALLERGIES No Known Allergies  PAST MEDICAL HISTORY Past Medical History:  Diagnosis Date   Allergy    GERD (gastroesophageal reflux disease)    Pulmonary embolism (HCC) 2018   finished 3 month course of Xarelto   Past Surgical History:  Procedure Laterality Date   EYE SURGERY Right    Rheg RD repair - Dr. Rennis Chris   GAS INSERTION Right 06/22/2020   Procedure: INSERTION OF GAS - C3F8;  Surgeon: Rennis Chris, MD;  Location: [facility] OR;  Service: Ophthalmology;  Laterality: Right;   GAS/FLUID EXCHANGE Right 06/22/2020   Procedure: GAS/FLUID EXCHANGE;  Surgeon: Rennis Chris, MD;  Location: [facility] OR;  Service: Ophthalmology;  Laterality: Right;   LASER PHOTO ABLATION Left 06/22/2020   Procedure: LASER PHOTO ABLATION;  Surgeon: Rennis Chris, MD;  Location: [facility] OR;  Service: Ophthalmology;  Laterality: Left;   NO PAST SURGERIES     PARS PLANA VITRECTOMY Right 06/22/2020   Procedure: PARS PLANA VITRECTOMY WITH 25 GAUGE;  Surgeon: Rennis Chris, MD;  Location: [facility] OR;  Service: Ophthalmology;  Laterality: Right;   PHOTOCOAGULATION WITH LASER Right 06/22/2020   Procedure: PHOTOCOAGULATION WITH LASER;  Surgeon: Rennis Chris, MD;   Location: [facility] OR;  Service: Ophthalmology;  Laterality: Right;   RETINAL DETACHMENT SURGERY Right    Dr. Rennis Chris    FAMILY HISTORY Family History  Problem Relation Age of Onset   Fibroids Mother    Hyperlipidemia Father    GER disease Father    Prostate cancer Father    Diabetes Maternal Grandmother    Diabetes Paternal Grandmother    Breast cancer Maternal Aunt     SOCIAL HISTORY Social History   Tobacco Use   Smoking status: Never   Smokeless tobacco: Never  Vaping Use   Vaping Use: Never used  Substance Use Topics   Alcohol use: Yes    Comment: rare   Drug use: No         OPHTHALMIC EXAM:  Base Eye Exam     Visual Acuity (Snellen - Linear)       Right Left   Dist cc 20/25- 20/20   Dist ph cc 20/25+     Correction: Contacts         Tonometry (Tonopen, 9:30 AM)       Right Left   Pressure 17 17         Pupils       Dark Light Shape React APD   Right 5 4 Round Brisk None   Left 5 4 Round Brisk None         Visual Fields (Counting fingers)       Left Right    Full Full         Extraocular Movement       Right Left    Full Full         Neuro/Psych     Oriented x3: Yes   Mood/Affect: Normal         Dilation     Both eyes: 1.0% Mydriacyl, 2.5% Phenylephrine @ 9:30 AM           Slit Lamp and Fundus Exam     Slit Lamp Exam       Right Left   Lids/Lashes Normal Normal   Conjunctiva/Sclera White and quiet White and quiet   Cornea 1+inferior PEE Trace Punctate epithelial erosions   Anterior Chamber deep and clear Deep and quiet   Iris Round and dilated Round and dilated   Lens 2+ Posterior subcapsular cataract with pigment deposition, Trace Cortical cataract Clear   Vitreous post vitrectomy; gas bubble gone, clear Vitreous syneresis, no pigment         Fundus Exam       Right Left   Disc Pink and Sharp, Tilted disc, temporal PPA Pink and Sharp, mildly Tilted disc, temporal PPA, Compact   C/D Ratio 0.3  0.3   Macula Flat, good foveal reflex, mild RPE mottling, mild ERM, No heme or edema Flat, Good foveal reflex, mild RPE mottling, No heme or edema   Vessels Normal mild attenuation   Periphery retina attached; IT retinotomy with good laser changes; good  360 laser; ORIGINALLY: Persistent SRF IT periphery (extending beyond laser barricade); shallow SRF temporal and superior periphery; lattice with round, atrophic holes at 1030 and 1045 within detachment, pigmented VR tuft at 0730 outside of detachment.  No new RT/RD. Attached, focal pigmented lattice IT quad at 0400, 0430 (with atrophic holes) and 0500 -- good laser in place, No new RT/RD/lattice           Refraction     Wearing Rx       Sphere Cylinder Axis   Right -8.25 +0.50 055   Left -8.25 +0.50 004         Wearing Rx #2       Sphere Cylinder Axis   Right -7.50 Sphere    Left -7.50 Sphere     Type: CL            IMAGING AND PROCEDURES  Imaging and Procedures for 01/02/2021  OCT, Retina - OU - Both Eyes       Right Eye Quality was good. Central Foveal Thickness: 273. Progression has been stable. Findings include normal foveal contour, no IRF, myopic contour, no SRF (Temporal retina re-attached, trace ERM).   Left Eye Quality was good. Central Foveal Thickness: 254. Progression has been stable. Findings include normal foveal contour, no IRF, no SRF, vitreomacular adhesion , myopic contour.   Notes *Images captured and stored on drive  Diagnosis / Impression: OD: Temporal retina re-attached. NFP; no IRF/SRF  OS: NFP, no IRF/SRF OS Myopic contour OU  Clinical management:  See below  Abbreviations: NFP - Normal foveal profile. CME - cystoid macular edema. PED - pigment epithelial detachment. IRF - intraretinal fluid. SRF - subretinal fluid. EZ - ellipsoid zone. ERM - epiretinal membrane. ORA - outer retinal atrophy. ORT - outer retinal tubulation. SRHM - subretinal hyper-reflective material. IRHM - intraretinal  hyper-reflective material            ASSESSMENT/PLAN:    ICD-10-CM   1. Right retinal detachment  H33.21     2. 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 Retinal hole of both eyes  H33.323     5. Other secondary cataract of right eye  H26.491      1,2. Rhegmatogenous retinal detachment, OD - detached from 11 to 8 oclock, with atrophic holes at 1030 and 1045 - s/p pneumatic retinopexy OD (12.13.21) - s/p supplemental barricade laser retinopexy OD 12.20.21  - s/p laser retinopexy w/ indirect ophthalmoscope OD 12.22.21  - s/p supplemental 100% C3F8 gas (0.1 cc), 01.03.22 - pre op exam and OCT with persistent shallow SRF in temporal periphery, greatest 8-9 oclock -- SRF extending beyond laser  - s/p 25g PPV/EL/FAX/14% C3F8 OD, 01.06.22             - doing well -- BCVA 20/25             - retina attached and in good position -- good laser changes  - +PSC OD, post vitrectomy  - f/u 6 months, RD OD, DFE, OCT  3,4. Lattice degeneration w/ atrophic holes, both eyes  - OD: lattice with atrophic holes and RD as above - OS: focal pigmented lattice IT at 0400, 0430 and 0500 --0430 with atrophic holes - s/p laser retinopexy OS (01.06.21) with good laser changes in place  5. PSC Cataract OD - Not affecting vision now - Monitor  Ophthalmic Meds Ordered this  visit:  No orders of the defined types were placed in this encounter.     Return in about 6 months (around 07/05/2021) for f/u RD OD, DFE, OCT.  There are no Patient Instructions on file for this visit.  Explained the diagnoses, plan, and follow up with the patient and they expressed understanding.  Patient expressed understanding of the importance of proper follow up care.   This document serves as a record of services personally performed by Karie Chimera, MD, PhD. It was created on their behalf by Cristopher Estimable, COT an ophthalmic technician. The  creation of this record is the provider's dictation and/or activities during the visit.    Electronically signed by: Cristopher Estimable, COT 7.18.22 @ 12:49 PM   This document serves as a record of services personally performed by Karie Chimera, MD, PhD. It was created on their behalf by Glee Arvin. Manson Passey, OA an ophthalmic technician. The creation of this record is the provider's dictation and/or activities during the visit.    Electronically signed by: Glee Arvin. Manson Passey, OA @TODAY @ 12:49 PM  , M.D., Ph.D. Diseases & Surgery of the Retina and Vitreous [facility]  I have reviewed the above documentation for accuracy and completeness, and I agree with the above. [facility], M.D., Ph.D. 01/02/21 12:49 PM  Abbreviations: M myopia (nearsighted); A astigmatism; H hyperopia (farsighted); P presbyopia; Mrx spectacle prescription;  CTL contact lenses; OD right eye; OS left eye; OU both eyes  XT exotropia; ET esotropia; PEK punctate epithelial keratitis; PEE punctate epithelial erosions; DES dry eye syndrome; MGD meibomian gland dysfunction; ATs artificial tears; PFAT's preservative free artificial tears; NSC nuclear sclerotic cataract; PSC posterior subcapsular cataract; ERM epi-retinal membrane; PVD posterior vitreous detachment; RD retinal detachment; DM diabetes mellitus; DR diabetic retinopathy; NPDR non-proliferative diabetic retinopathy; PDR proliferative diabetic retinopathy; CSME clinically significant macular edema; DME diabetic macular edema; dbh dot blot hemorrhages; CWS cotton wool spot; POAG primary open angle glaucoma; C/D cup-to-disc ratio; HVF humphrey visual field; GVF goldmann visual field; OCT optical coherence tomography; IOP intraocular pressure; BRVO Branch retinal vein occlusion; CRVO central retinal vein occlusion; CRAO central retinal artery occlusion; BRAO branch retinal artery occlusion; RT retinal tear; SB scleral buckle; PPV pars plana  vitrectomy; VH Vitreous hemorrhage; PRP panretinal laser photocoagulation; IVK intravitreal kenalog; VMT vitreomacular traction; MH Macular hole;  NVD neovascularization of the disc; NVE neovascularization elsewhere; AREDS age related eye disease study; ARMD age related macular degeneration; POAG primary open angle glaucoma; EBMD epithelial/anterior basement membrane dystrophy; ACIOL anterior chamber intraocular lens; IOL intraocular lens; PCIOL posterior chamber intraocular lens; Phaco/IOL phacoemulsification with intraocular lens placement; PRK photorefractive keratectomy; LASIK laser assisted in situ keratomileusis; HTN hypertension; DM diabetes mellitus; COPD chronic obstructive pulmonary disease

## 2021-01-02 ENCOUNTER — Encounter (INDEPENDENT_AMBULATORY_CARE_PROVIDER_SITE_OTHER): Payer: Self-pay | Admitting: Ophthalmology

## 2021-01-02 ENCOUNTER — Other Ambulatory Visit: Payer: Self-pay

## 2021-01-02 ENCOUNTER — Ambulatory Visit (INDEPENDENT_AMBULATORY_CARE_PROVIDER_SITE_OTHER): Payer: No Typology Code available for payment source | Admitting: Ophthalmology

## 2021-01-02 DIAGNOSIS — H3321 Serous retinal detachment, right eye: Secondary | ICD-10-CM | POA: Diagnosis not present

## 2021-01-02 DIAGNOSIS — H33323 Round hole, bilateral: Secondary | ICD-10-CM

## 2021-01-02 DIAGNOSIS — H35413 Lattice degeneration of retina, bilateral: Secondary | ICD-10-CM | POA: Diagnosis not present

## 2021-01-02 DIAGNOSIS — H26491 Other secondary cataract, right eye: Secondary | ICD-10-CM

## 2021-01-02 DIAGNOSIS — H3581 Retinal edema: Secondary | ICD-10-CM | POA: Diagnosis not present

## 2021-04-16 ENCOUNTER — Other Ambulatory Visit (HOSPITAL_COMMUNITY): Payer: Self-pay

## 2021-05-22 ENCOUNTER — Encounter: Payer: Self-pay | Admitting: Family Medicine

## 2021-05-22 ENCOUNTER — Other Ambulatory Visit: Payer: Self-pay | Admitting: *Deleted

## 2021-05-22 ENCOUNTER — Other Ambulatory Visit: Payer: Self-pay | Admitting: Family Medicine

## 2021-05-22 DIAGNOSIS — R7989 Other specified abnormal findings of blood chemistry: Secondary | ICD-10-CM

## 2021-05-23 ENCOUNTER — Other Ambulatory Visit: Payer: Self-pay | Admitting: *Deleted

## 2021-05-23 LAB — COMPREHENSIVE METABOLIC PANEL
ALT: 18 IU/L (ref 0–32)
AST: 21 IU/L (ref 0–40)
Albumin/Globulin Ratio: 1.6 (ref 1.2–2.2)
Albumin: 4.3 g/dL (ref 3.8–4.8)
Alkaline Phosphatase: 108 IU/L (ref 44–121)
BUN/Creatinine Ratio: 12 (ref 9–23)
BUN: 12 mg/dL (ref 6–20)
Bilirubin Total: 0.5 mg/dL (ref 0.0–1.2)
CO2: 24 mmol/L (ref 20–29)
Calcium: 9.4 mg/dL (ref 8.7–10.2)
Chloride: 103 mmol/L (ref 96–106)
Creatinine, Ser: 0.98 mg/dL (ref 0.57–1.00)
Globulin, Total: 2.7 g/dL (ref 1.5–4.5)
Glucose: 82 mg/dL (ref 70–99)
Potassium: 4.2 mmol/L (ref 3.5–5.2)
Sodium: 138 mmol/L (ref 134–144)
Total Protein: 7 g/dL (ref 6.0–8.5)
eGFR: 77 mL/min/{1.73_m2} (ref >59)

## 2021-06-07 ENCOUNTER — Telehealth: Payer: No Typology Code available for payment source | Admitting: Physician Assistant

## 2021-06-07 DIAGNOSIS — J019 Acute sinusitis, unspecified: Secondary | ICD-10-CM

## 2021-06-07 DIAGNOSIS — B9689 Other specified bacterial agents as the cause of diseases classified elsewhere: Secondary | ICD-10-CM

## 2021-06-07 MED ORDER — AMOXICILLIN-POT CLAVULANATE 875-125 MG PO TABS: 1.0000 | ORAL_TABLET | Freq: Two times a day (BID) | ORAL | 0 refills | Status: DC

## 2021-06-07 NOTE — Progress Notes (Signed)

## 2021-07-04 NOTE — Progress Notes (Signed)
[facility] - Clinic Note  07/09/2021     CHIEF COMPLAINT Patient presents for Retina Follow Up   HISTORY OF PRESENT ILLNESS: Savannah Holland is a 36 y.o. female who presents to the clinic today for:   HPI     Retina Follow Up   Patient presents with  Retinal Break/Detachment.  In right eye.  This started 6 months ago.  I, the attending physician,  performed the HPI with the patient and updated documentation appropriately.        Comments   Patient here for 6 months retina follow up for RD OD. Patient states vision doing pretty good. No eye pain. Has occasional floater, but goes away. Has new glasses.       Last edited by Rennis ChrisZamora, Sims Laday, MD on 07/09/2021  9:18 AM.    Pt states she went to her regular eye dr in October, she states they told her everything was good, she has gotten new glasses since her last visit here  Referring physician: Reva BoresPratt, Tanya S, MD [address] [place] [id number]  HISTORICAL INFORMATION:   Selected notes from the MEDICAL RECORD NUMBER Referred by Dr. 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 (Ophthalmic Drugs)   No current facility-administered medications for this visit. (Ophthalmic Drugs)   Current Outpatient Medications (Other)  Medication Sig   amoxicillin-clavulanate (AUGMENTIN) 875-125 MG tablet Take 1 tablet by mouth 2 (two) times daily.   Biotin 1914710000 MCG TABS Take by mouth.   levonorgestrel (MIRENA) 20 MCG/24HR IUD 1 each by Intrauterine route once.   acetaminophen (TYLENOL) 500 MG tablet Take 500 mg by mouth daily as needed (Neck pain). (Patient not taking: Reported on 01/02/2021)   No current facility-administered medications for this visit. (Other)   REVIEW OF SYSTEMS: ROS   Positive for: Eyes Negative for: Constitutional, Gastrointestinal, Neurological, Skin, Genitourinary, Musculoskeletal, HENT,  Endocrine, Cardiovascular, Respiratory, Psychiatric, Allergic/Imm, Heme/Lymph Last edited by Laddie Aquaslarke, Rebecca S, COA on 07/09/2021  8:28 AM.     ALLERGIES No Known Allergies  PAST MEDICAL HISTORY Past Medical History:  Diagnosis Date   Allergy    GERD (gastroesophageal reflux disease)    Pulmonary embolism (HCC) 2018   finished 3 month course of Xarelto   Past Surgical History:  Procedure Laterality Date   EYE SURGERY Right    Rheg RD repair - Dr. Rennis ChrisBrian Mckoy Bhakta   GAS INSERTION Right 06/22/2020   Procedure: INSERTION OF GAS - C3F8;  Surgeon: Rennis ChrisZamora, Sundai Probert, MD;  [facility];  Service: Ophthalmology;  Laterality: Right;   GAS/FLUID EXCHANGE Right 06/22/2020   Procedure: GAS/FLUID EXCHANGE;  Surgeon: Rennis ChrisZamora, Brandee Markin, MD;  [facility];  Service: Ophthalmology;  Laterality: Right;   LASER PHOTO ABLATION Left 06/22/2020   Procedure: LASER PHOTO ABLATION;  Surgeon: Rennis ChrisZamora, Lashayla Armes, MD;  [facility];  Service: Ophthalmology;  Laterality: Left;   NO PAST SURGERIES     PARS PLANA VITRECTOMY Right 06/22/2020   Procedure: PARS PLANA VITRECTOMY WITH 25 GAUGE;  Surgeon: Rennis ChrisZamora, Shaquan Puerta, MD;  [facility];  Service: Ophthalmology;  Laterality: Right;   PHOTOCOAGULATION WITH LASER Right 06/22/2020   Procedure: PHOTOCOAGULATION WITH LASER;  Surgeon: Rennis ChrisZamora, Heli Dino, MD;  [facility];  Service: Ophthalmology;  Laterality: Right;   RETINAL DETACHMENT SURGERY Right    Dr. Rennis ChrisBrian Deshundra Waller    FAMILY HISTORY Family History  Problem Relation Age of  Onset   Fibroids Mother    Hyperlipidemia Father    GER disease Father    Prostate cancer Father    Diabetes Maternal Grandmother    Diabetes Paternal Grandmother    Breast cancer Maternal Aunt     SOCIAL HISTORY Social History   Tobacco Use   Smoking status: Never   Smokeless tobacco: Never  Vaping Use   Vaping Use: Never used  Substance Use Topics   Alcohol use: Yes    Comment: rare   Drug use: No       OPHTHALMIC EXAM:  Base Eye Exam      Visual Acuity (Snellen - Linear)       Right Left   Dist cc 20/20 -2 20/20    Correction: Glasses         Tonometry (Tonopen, 8:24 AM)       Right Left   Pressure 12 13         Pupils       Dark Light Shape React APD   Right 5 4 Round Brisk None   Left 5 4 Round Brisk None         Visual Fields (Counting fingers)       Left Right    Full Full         Extraocular Movement       Right Left    Full, Ortho Full, Ortho         Neuro/Psych     Oriented x3: Yes   Mood/Affect: Normal         Dilation     Both eyes: 1.0% Mydriacyl, 2.5% Phenylephrine @ 8:23 AM           Slit Lamp and Fundus Exam     Slit Lamp Exam       Right Left   Lids/Lashes Normal Normal   Conjunctiva/Sclera White and quiet White and quiet   Cornea Trace inferior PEE Trace Punctate epithelial erosions   Anterior Chamber deep and clear Deep and quiet   Iris Round and dilated Round and dilated   Lens 2+ Posterior subcapsular cataract with pigment deposition, Trace Cortical cataract Clear   Anterior Vitreous post vitrectomy; gas bubble gone, clear Vitreous syneresis, no pigment         Fundus Exam       Right Left   Disc Pink and Sharp, Tilted disc, temporal PPA Pink and Sharp, mildly Tilted disc, temporal PPA, Compact   C/D Ratio 0.3 0.3   Macula Flat, good foveal reflex, mild RPE mottling, mild ERM, No heme or edema Flat, Good foveal reflex, mild RPE mottling, No heme or edema   Vessels Normal mild attenuation   Periphery retina attached; IT retinotomy with good laser changes; good 360 laser; ORIGINALLY: Persistent SRF IT periphery (extending beyond laser barricade); shallow SRF temporal and superior periphery; lattice with round, atrophic holes at 1030 and 1045 within detachment, pigmented VR tuft at 0730 outside of detachment.  No new RT/RD. Attached, focal pigmented lattice IT quad at 0400, 0430 (with atrophic holes) and 0500 -- good laser in place, No new  RT/RD/lattice           Refraction     Wearing Rx       Sphere Cylinder Axis Add   Right -8.25 +0.50 055    Left -8.25 +0.50 004          Wearing Rx #2       Sphere Cylinder Axis Add  Right -8.50 +0.25 080 +0.00   Left -8.75 +0.25 106 +0.00    Type: CL            IMAGING AND PROCEDURES  Imaging and Procedures for 07/09/2021  OCT, Retina - OU - Both Eyes       Right Eye Quality was borderline. Central Foveal Thickness: 283. Progression has been stable. Findings include normal foveal contour, no IRF, myopic contour, no SRF (Temporal retina re-attached, trace ERM).   Left Eye Quality was good. Central Foveal Thickness: 259. Progression has been stable. Findings include normal foveal contour, no IRF, no SRF, vitreomacular adhesion , myopic contour.   Notes *Images captured and stored on drive  Diagnosis / Impression: OD: Temporal retina re-attached. NFP; no IRF/SRF  OS: NFP, no IRF/SRF OS Myopic contour OU  Clinical management:  See below  Abbreviations: NFP - Normal foveal profile. CME - cystoid macular edema. PED - pigment epithelial detachment. IRF - intraretinal fluid. SRF - subretinal fluid. EZ - ellipsoid zone. ERM - epiretinal membrane. ORA - outer retinal atrophy. ORT - outer retinal tubulation. SRHM - subretinal hyper-reflective material. IRHM - intraretinal hyper-reflective material            ASSESSMENT/PLAN:    ICD-10-CM   1. 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 Retinal hole of both eyes  H33.323     4. Other secondary cataract of right eye  H26.491      1. Rhegmatogenous retinal detachment, OD - detached from 11 to 8 oclock, with atrophic holes at 1030 and 1045 - s/p pneumatic retinopexy OD (12.13.21) - s/p supplemental barricade laser retinopexy OD 12.20.21  - s/p laser retinopexy w/ indirect ophthalmoscope OD 12.22.21  - s/p supplemental 100%  C3F8 gas (0.1 cc), 01.03.22 - pre op exam and OCT with persistent shallow SRF in temporal periphery, greatest 8-9 oclock -- SRF extending beyond laser  - s/p 25g PPV/EL/FAX/14% C3F8 OD, 01.06.22             - doing well -- BCVA 20/20             - retina attached and in good position -- good laser changes  - +PSC OD, post vitrectomy  - f/u 1 year, RD OD, DFE, OCT  2,3. Lattice degeneration w/ atrophic holes, both eyes  - OD: lattice with atrophic holes and RD as above - OS: focal pigmented lattice IT at 0400, 0430 and 0500 --0430 with atrophic holes - s/p laser retinopexy OS (01.06.21) with good laser changes in place - no new RT/RD or lattice OU - monitor - f/u 1 yr  4. PSC Cataract OD - Not affecting vision now - monitor  Ophthalmic Meds Ordered this visit:  No orders of the defined types were placed in this encounter.    Return in about 1 year (around 07/09/2022) for f/u RD OD, DFE, OCT.  There are no Patient Instructions on file for this visit.  Explained the diagnoses, plan, and follow up with the patient and they expressed understanding.  Patient expressed understanding of the importance of proper follow up care.   This document serves as a record of services personally performed by Karie ChimeraBrian G. Tedi Hughson, MD, PhD. It was created on their behalf by Annalee Gentaaryl Barber, COMT. The creation of this record is the provider's dictation and/or activities during the visit.  Electronically signed by: Annalee Gentaaryl Barber, COMT  07/09/21 9:20 AM  This document serves as a record of services personally performed by Karie Chimera, MD, PhD. It was created on their behalf by Glee Arvin. Manson Passey, OA an ophthalmic technician. The creation of this record is the provider's dictation and/or activities during the visit.    Electronically signed by: Glee Arvin. Manson Passey, [place] 01.23.2023 9:20 AM   Karie Chimera, M.D., Ph.D. Diseases & Surgery of the Retina and Vitreous [facility]  I have reviewed  the above documentation for accuracy and completeness, and I agree with the above. Karie Chimera, M.D., Ph.D. 07/09/21 9:22 AM   Abbreviations: M myopia (nearsighted); A astigmatism; H hyperopia (farsighted); P presbyopia; Mrx spectacle prescription;  CTL contact lenses; OD right eye; OS left eye; OU both eyes  XT exotropia; ET esotropia; PEK punctate epithelial keratitis; PEE punctate epithelial erosions; DES dry eye syndrome; MGD meibomian gland dysfunction; ATs artificial tears; PFAT's preservative free artificial tears; NSC nuclear sclerotic cataract; PSC posterior subcapsular cataract; ERM epi-retinal membrane; PVD posterior vitreous detachment; RD retinal detachment; DM diabetes mellitus; DR diabetic retinopathy; NPDR non-proliferative diabetic retinopathy; PDR proliferative diabetic retinopathy; CSME clinically significant macular edema; DME diabetic macular edema; dbh dot blot hemorrhages; CWS cotton wool spot; POAG primary open angle glaucoma; C/D cup-to-disc ratio; HVF humphrey visual field; GVF goldmann visual field; OCT optical coherence tomography; IOP intraocular pressure; BRVO Branch retinal vein occlusion; CRVO central retinal vein occlusion; CRAO central retinal artery occlusion; BRAO branch retinal artery occlusion; RT retinal tear; SB scleral buckle; PPV pars plana vitrectomy; VH Vitreous hemorrhage; PRP panretinal laser photocoagulation; IVK intravitreal kenalog; VMT vitreomacular traction; MH Macular hole;  NVD neovascularization of the disc; NVE neovascularization elsewhere; AREDS age related eye disease study; ARMD age related macular degeneration; POAG primary open angle glaucoma; EBMD epithelial/anterior basement membrane dystrophy; ACIOL anterior chamber intraocular lens; IOL intraocular lens; PCIOL posterior chamber intraocular lens; Phaco/IOL phacoemulsification with intraocular lens placement; PRK photorefractive keratectomy; LASIK laser assisted in situ keratomileusis; HTN  hypertension; DM diabetes mellitus; COPD chronic obstructive pulmonary disease

## 2021-07-05 ENCOUNTER — Encounter (INDEPENDENT_AMBULATORY_CARE_PROVIDER_SITE_OTHER): Payer: No Typology Code available for payment source | Admitting: Ophthalmology

## 2021-07-09 ENCOUNTER — Ambulatory Visit (INDEPENDENT_AMBULATORY_CARE_PROVIDER_SITE_OTHER): Payer: No Typology Code available for payment source | Admitting: Ophthalmology

## 2021-07-09 ENCOUNTER — Encounter (INDEPENDENT_AMBULATORY_CARE_PROVIDER_SITE_OTHER): Payer: Self-pay | Admitting: Ophthalmology

## 2021-07-09 ENCOUNTER — Other Ambulatory Visit: Payer: Self-pay

## 2021-07-09 DIAGNOSIS — H26491 Other secondary cataract, right eye: Secondary | ICD-10-CM | POA: Diagnosis not present

## 2021-07-09 DIAGNOSIS — H3321 Serous retinal detachment, right eye: Secondary | ICD-10-CM

## 2021-07-09 DIAGNOSIS — H35413 Lattice degeneration of retina, bilateral: Secondary | ICD-10-CM | POA: Diagnosis not present

## 2021-07-09 DIAGNOSIS — H33323 Round hole, bilateral: Secondary | ICD-10-CM | POA: Diagnosis not present

## 2021-07-21 ENCOUNTER — Encounter: Payer: Self-pay | Admitting: Radiology

## 2021-10-08 ENCOUNTER — Ambulatory Visit: Payer: No Typology Code available for payment source | Admitting: Family Medicine

## 2021-10-10 ENCOUNTER — Encounter: Payer: Self-pay | Admitting: Family Medicine

## 2021-10-15 ENCOUNTER — Telehealth: Payer: No Typology Code available for payment source | Admitting: Family Medicine

## 2021-10-17 ENCOUNTER — Other Ambulatory Visit (HOSPITAL_COMMUNITY): Payer: Self-pay

## 2021-10-17 ENCOUNTER — Telehealth (INDEPENDENT_AMBULATORY_CARE_PROVIDER_SITE_OTHER): Payer: No Typology Code available for payment source | Admitting: Family Medicine

## 2021-10-17 ENCOUNTER — Encounter: Payer: Self-pay | Admitting: Family Medicine

## 2021-10-17 VITALS — Ht 64.5 in | Wt 246.0 lb

## 2021-10-17 DIAGNOSIS — Z6841 Body Mass Index (BMI) 40.0 and over, adult: Secondary | ICD-10-CM

## 2021-10-17 MED ORDER — OZEMPIC (0.25 OR 0.5 MG/DOSE) 2 MG/3ML ~~LOC~~ SOPN
0.2500 mg | PEN_INJECTOR | SUBCUTANEOUS | 1 refills | Status: DC
Start: 2021-10-17 — End: 2021-10-26
  Filled 2021-10-17: qty 3, 56d supply, fill #0

## 2021-10-17 NOTE — Assessment & Plan Note (Signed)
Trial of Wegovy, start at lowest dose. reviewed mechanism of action and usual dosing. Weight loss goals with diet and exercise reviewed. Will see in 4 weeks and see if there has been weight loss and increase dose if tolerating.  ?

## 2021-10-17 NOTE — Progress Notes (Signed)
? ? ?GYNECOLOGY VIRTUAL VISIT ENCOUNTER NOTE ? ?Provider location: [facility] for [employer] at [facility]  ? ?Patient location: Home ? ?I connected with Savannah Holland on 10/17/21 at  1:10 PM EDT by MyChart Video Encounter and verified that I am speaking with the correct person using two identifiers. ?  ?I discussed the limitations, risks, security and privacy concerns of performing an evaluation and management service virtually and the availability of in person appointments. I also discussed with the patient that there may be a patient responsible charge related to this service. The patient expressed understanding and agreed to proceed. ?  ?History:  ?Savannah Holland is a 36 y.o. G51P1001 female being evaluated today for weight management. She has no h/o DM, HTN, or sleep apnea. Has tried diet and exercise and failed to achieve her weight loss goals. She is interested in trying a GLP-1. She denies any abnormal vaginal discharge, bleeding, pelvic pain or other concerns.   ?  ?  ?Past Medical History:  ?Diagnosis Date  ? Allergy   ? GERD (gastroesophageal reflux disease)   ? Pulmonary embolism ([place]) 2018  ? finished 3 month course of Xarelto  ? ?Past Surgical History:  ?Procedure Laterality Date  ? EYE SURGERY Right   ? Rheg RD repair - Dr. Bernarda Caffey  ? GAS INSERTION Right 06/22/2020  ? Procedure: INSERTION OF GAS - C3F8;  Surgeon: Bernarda Caffey, MD;  Location: [place];  Service: Ophthalmology;  Laterality: Right;  ? GAS/FLUID EXCHANGE Right 06/22/2020  ? Procedure: GAS/FLUID EXCHANGE;  Surgeon: Bernarda Caffey, MD;  Location: [place];  Service: Ophthalmology;  Laterality: Right;  ? LASER PHOTO ABLATION Left 06/22/2020  ? Procedure: LASER PHOTO ABLATION;  Surgeon: Bernarda Caffey, MD;  Location: [place];  Service: Ophthalmology;  Laterality: Left;  ? NO PAST SURGERIES    ? PARS PLANA VITRECTOMY Right 06/22/2020  ? Procedure: PARS PLANA VITRECTOMY WITH 25 GAUGE;  Surgeon: Bernarda Caffey, MD;  Location: [place];  Service:  Ophthalmology;  Laterality: Right;  ? PHOTOCOAGULATION WITH LASER Right 06/22/2020  ? Procedure: PHOTOCOAGULATION WITH LASER;  Surgeon: Bernarda Caffey, MD;  Location: [place];  Service: Ophthalmology;  Laterality: Right;  ? RETINAL DETACHMENT SURGERY Right   ? Dr. Bernarda Caffey  ? ?The following portions of the patient's history were reviewed and updated as appropriate: allergies, current medications, past family history, past medical history, past social history, past surgical history and problem list.  ? ?Health Maintenance:  Normal pap and negative HRHPV on 08/03/2019.   ? ?Review of Systems:  ?Pertinent items noted in HPI and remainder of comprehensive ROS otherwise negative. ? ?Physical Exam:  ? ?General:  Alert, oriented and cooperative. Patient appears to be in no acute distress.  ?Mental Status: Normal mood and affect. Normal behavior. Normal judgment and thought content.   ?Respiratory: Normal respiratory effort, no problems with respiration noted  ?Rest of physical exam deferred due to type of encounter ? ?Labs and Imaging ?No results found for this or any previous visit (from the past 336 hour(s)). ?No results found.   ?  ?Assessment and Plan:  ?   ?Problem List Items Addressed This Visit   ? ?  ? Unprioritized  ? Obesity - Primary  ?  Trial of Wegovy, start at lowest dose. reviewed mechanism of action and usual dosing. Weight loss goals with diet and exercise reviewed. Will see in 4 weeks and see if there has been weight loss and increase dose if tolerating.  ? ?  ?  ?  Relevant Medications  ? Semaglutide,0.25 or 0.5MG /DOS, (OZEMPIC, 0.25 OR 0.5 MG/DOSE,) 2 MG/3ML SOPN  ? ? ?   ?  ?I discussed the assessment and treatment plan with the patient. The patient was provided an opportunity to ask questions and all were answered. The patient agreed with the plan and demonstrated an understanding of the instructions. ?  ?The patient was advised to call back or seek an in-person evaluation/go to the ED if the symptoms  worsen or if the condition fails to improve as anticipated. ? ?I provided 11 minutes of face-to-face time during this encounter. ? ? ?Donnamae Jude, MD ?Center for [place], [place] ? ?

## 2021-10-17 NOTE — Progress Notes (Signed)
Discuss weight management  ?

## 2021-10-25 ENCOUNTER — Encounter: Payer: Self-pay | Admitting: *Deleted

## 2021-10-26 ENCOUNTER — Other Ambulatory Visit (HOSPITAL_COMMUNITY): Payer: Self-pay

## 2021-10-26 ENCOUNTER — Other Ambulatory Visit: Payer: Self-pay | Admitting: Family Medicine

## 2021-10-26 ENCOUNTER — Other Ambulatory Visit: Payer: Self-pay | Admitting: *Deleted

## 2021-10-26 MED ORDER — SAXENDA 18 MG/3ML ~~LOC~~ SOPN
3.0000 mg | PEN_INJECTOR | Freq: Every day | SUBCUTANEOUS | 2 refills | Status: DC
  Filled 2021-10-26 – 2021-10-31 (×2): qty 15, 30d supply, fill #0
  Filled 2021-12-11: qty 15, 30d supply, fill #1
  Filled 2022-01-14: qty 15, 30d supply, fill #0
  Filled 2022-01-14: qty 15, 30d supply, fill #2

## 2021-10-26 MED ORDER — SAXENDA 18 MG/3ML ~~LOC~~ SOPN
3.0000 mg | PEN_INJECTOR | Freq: Every day | SUBCUTANEOUS | 2 refills | Status: DC
  Filled 2021-10-26: qty 4, 8d supply, fill #0

## 2021-10-26 NOTE — Progress Notes (Signed)
erroneous

## 2021-10-31 ENCOUNTER — Other Ambulatory Visit (HOSPITAL_COMMUNITY): Payer: Self-pay

## 2021-10-31 ENCOUNTER — Encounter: Payer: Self-pay | Admitting: *Deleted

## 2021-10-31 ENCOUNTER — Other Ambulatory Visit: Payer: Self-pay | Admitting: Family Medicine

## 2021-10-31 MED ORDER — INSULIN PEN NEEDLE 32G X 4 MM MISC
1.0000 | 0 refills | Status: DC
  Filled 2021-10-31: qty 100, 90d supply, fill #0

## 2021-11-06 ENCOUNTER — Other Ambulatory Visit (HOSPITAL_COMMUNITY): Payer: Self-pay

## 2021-11-16 ENCOUNTER — Other Ambulatory Visit: Payer: Self-pay

## 2021-11-21 ENCOUNTER — Ambulatory Visit (INDEPENDENT_AMBULATORY_CARE_PROVIDER_SITE_OTHER): Payer: No Typology Code available for payment source | Admitting: Family Medicine

## 2021-11-21 ENCOUNTER — Other Ambulatory Visit (HOSPITAL_COMMUNITY)
Admission: RE | Admit: 2021-11-21 | Discharge: 2021-11-21 | Disposition: A | Discharge status: Home / Self Care | Payer: No Typology Code available for payment source | Source: Ambulatory Visit | Attending: Family Medicine | Admitting: Family Medicine

## 2021-11-21 ENCOUNTER — Encounter: Payer: Self-pay | Admitting: Family Medicine

## 2021-11-21 VITALS — BP 128/88 | HR 85 | Ht 64.5 in | Wt 238.0 lb

## 2021-11-21 DIAGNOSIS — Z01419 Encounter for gynecological examination (general) (routine) without abnormal findings: Secondary | ICD-10-CM | POA: Diagnosis not present

## 2021-11-21 DIAGNOSIS — Z124 Encounter for screening for malignant neoplasm of cervix: Secondary | ICD-10-CM

## 2021-11-21 DIAGNOSIS — Z975 Presence of (intrauterine) contraceptive device: Secondary | ICD-10-CM | POA: Diagnosis not present

## 2021-11-21 NOTE — Patient Instructions (Signed)

## 2021-11-21 NOTE — Progress Notes (Signed)
LMP:10/25/2021 Monthly with Moderate flow last 3 days. Last Pap:08/03/2019  ASCUS  Contraception: Mirena IUD 06/18/2016 STD Screening:No Family Hx of Breast Cancer:Yes Family Hx of Ovarian Cancer:No  CC: None

## 2021-11-21 NOTE — Progress Notes (Signed)
Subjective:     Savannah Holland is a 36 y.o. female and is here for a comprehensive physical exam. The patient reports no problems. Has light cycles with IUD in place.   The following portions of the patient's history were reviewed and updated as appropriate: allergies, current medications, past family history, past medical history, past social history, past surgical history, and problem list.  Review of Systems Pertinent items noted in HPI and remainder of comprehensive ROS otherwise negative.   Objective:    BP 128/88   Pulse 85   Ht 5' 4.5" (1.638 m)   Wt 238 lb (108 kg)   LMP 10/25/2021 (Exact Date)   BMI 40.22 kg/m  General appearance: alert, cooperative, and appears stated age Head: Normocephalic, without obvious abnormality, atraumatic Neck: no adenopathy, supple, symmetrical, trachea midline, and thyroid not enlarged, symmetric, no tenderness/mass/nodules Lungs: clear to auscultation bilaterally Breasts: normal appearance, no masses or tenderness Heart: regular rate and rhythm, S1, S2 normal, no murmur, click, rub or gallop Abdomen: soft, non-tender; bowel sounds normal; no masses,  no organomegaly Pelvic: cervix normal in appearance, external genitalia normal, no adnexal masses or tenderness, no cervical motion tenderness, uterus normal size, shape, and consistency, and vagina normal without discharge Extremities: extremities normal, atraumatic, no cyanosis or edema Pulses: 2+ and symmetric Skin: Skin color, texture, turgor normal. No rashes or lesions Lymph nodes: Cervical, supraclavicular, and axillary nodes normal. Neurologic: Grossly normal    Assessment:    Healthy female exam.      Plan:  Screening for cervical cancer - Plan: Cytology - PAP  Screening for malignant neoplasm of cervix  Encounter for gynecological examination without abnormal finding  Mirena IUD (intrauterine device) in place since 2019  Return in 1 year (on 11/22/2022).  Needs TDaP   See  After Visit Summary for Counseling Recommendations

## 2021-11-26 LAB — CYTOLOGY - PAP
Adequacy: ABSENT
Comment: NEGATIVE
Diagnosis: NEGATIVE
High risk HPV: NEGATIVE

## 2021-12-11 ENCOUNTER — Other Ambulatory Visit (HOSPITAL_COMMUNITY): Payer: Self-pay

## 2022-01-14 ENCOUNTER — Other Ambulatory Visit (HOSPITAL_COMMUNITY): Payer: Self-pay

## 2022-01-15 ENCOUNTER — Other Ambulatory Visit (HOSPITAL_COMMUNITY): Payer: Self-pay

## 2022-02-14 ENCOUNTER — Other Ambulatory Visit: Payer: Self-pay | Admitting: Family Medicine

## 2022-02-19 ENCOUNTER — Encounter: Payer: Self-pay | Admitting: Family Medicine

## 2022-02-20 ENCOUNTER — Other Ambulatory Visit (HOSPITAL_COMMUNITY): Payer: Self-pay

## 2022-02-20 MED ORDER — SAXENDA 18 MG/3ML ~~LOC~~ SOPN
3.0000 mg | PEN_INJECTOR | Freq: Every day | SUBCUTANEOUS | 2 refills | Status: DC
  Filled 2022-02-20: qty 15, 30d supply, fill #0
  Filled 2022-04-10: qty 15, 30d supply, fill #1

## 2022-02-22 ENCOUNTER — Other Ambulatory Visit (HOSPITAL_COMMUNITY): Payer: Self-pay

## 2022-02-25 ENCOUNTER — Other Ambulatory Visit (HOSPITAL_COMMUNITY): Payer: Self-pay

## 2022-03-15 ENCOUNTER — Encounter: Payer: Self-pay | Admitting: *Deleted

## 2022-04-05 ENCOUNTER — Encounter: Payer: Self-pay | Admitting: Family Medicine

## 2022-04-10 ENCOUNTER — Other Ambulatory Visit (HOSPITAL_COMMUNITY): Payer: Self-pay

## 2022-04-11 ENCOUNTER — Other Ambulatory Visit (HOSPITAL_COMMUNITY): Payer: Self-pay

## 2022-04-17 ENCOUNTER — Encounter: Payer: Self-pay | Admitting: Family Medicine

## 2022-04-17 ENCOUNTER — Other Ambulatory Visit (HOSPITAL_COMMUNITY): Payer: Self-pay

## 2022-04-17 ENCOUNTER — Telehealth: Payer: Self-pay

## 2022-04-17 ENCOUNTER — Telehealth (INDEPENDENT_AMBULATORY_CARE_PROVIDER_SITE_OTHER): Payer: No Typology Code available for payment source | Admitting: Family Medicine

## 2022-04-17 VITALS — Ht 64.0 in | Wt 214.0 lb

## 2022-04-17 DIAGNOSIS — Z6836 Body mass index (BMI) 36.0-36.9, adult: Secondary | ICD-10-CM

## 2022-04-17 MED ORDER — WEGOVY 1 MG/0.5ML ~~LOC~~ SOAJ
1.0000 mg | SUBCUTANEOUS | 1 refills | Status: DC
  Filled 2022-04-17: qty 2, 28d supply, fill #0

## 2022-04-17 NOTE — Progress Notes (Signed)
GYNECOLOGY VIRTUAL VISIT ENCOUNTER NOTE  Provider location: [facility] for [employer] at [facility]   Patient location: Work, in private area. OK to discuss care per patient report  I connected with Joelle Primmer on 04/17/22 at  1:30 PM EDT by MyChart Video Encounter and verified that I am speaking with the correct person using two identifiers.   I discussed the limitations, risks, security and privacy concerns of performing an evaluation and management service virtually and the availability of in person appointments. I also discussed with the patient that there may be a patient responsible charge related to this service. The patient expressed understanding and agreed to proceed.   History:  Minette Manders is a 36 y.o. G48P1001 female being evaluated today for weight loss management. She denies any abnormal vaginal discharge, bleeding, pelvic pain or other concerns.     Reports feeling well on medication, has occasional lightheaded feelings but not severe. She is currently on Saxenda Full dose . Reports some constipation that has been improved with miralax.     Past Medical History:  Diagnosis Date   Allergy    GERD (gastroesophageal reflux disease)    Pulmonary embolism (HCC) 2018   finished 3 month course of Xarelto   Past Surgical History:  Procedure Laterality Date   EYE SURGERY Right    Rheg RD repair - Dr. Rennis Chris   GAS INSERTION Right 06/22/2020   Procedure: INSERTION OF GAS - C3F8;  Surgeon: Rennis Chris, MD;  Location: [facility] OR;  Service: Ophthalmology;  Laterality: Right;   GAS/FLUID EXCHANGE Right 06/22/2020   Procedure: GAS/FLUID EXCHANGE;  Surgeon: Rennis Chris, MD;  Location: [facility] OR;  Service: Ophthalmology;  Laterality: Right;   LASER PHOTO ABLATION Left 06/22/2020   Procedure: LASER PHOTO ABLATION;  Surgeon: Rennis Chris, MD;  Location: [facility] OR;  Service: Ophthalmology;  Laterality: Left;   NO PAST SURGERIES     PARS PLANA VITRECTOMY Right 06/22/2020    Procedure: PARS PLANA VITRECTOMY WITH 25 GAUGE;  Surgeon: Rennis Chris, MD;  Location: [facility];  Service: Ophthalmology;  Laterality: Right;   PHOTOCOAGULATION WITH LASER Right 06/22/2020   Procedure: PHOTOCOAGULATION WITH LASER;  Surgeon: Rennis Chris, MD;  Location: [facility];  Service: Ophthalmology;  Laterality: Right;   RETINAL DETACHMENT SURGERY Right    Dr. Rennis Chris   The following portions of the patient's history were reviewed and updated as appropriate: allergies, current medications, past family history, past medical history, past social history, past surgical history and problem list.   Health Maintenance:  Normal pap and negative HRHPV   Review of Systems:  Pertinent items noted in HPI and remainder of comprehensive ROS otherwise negative.  Physical Exam:  Ht 5\' 4"  (1.626 m)   Wt 214 lb (97.1 kg)   BMI 36.73 kg/m    General:  Alert, oriented and cooperative. Patient appears to be in no acute distress.  Mental Status: Normal mood and affect. Normal behavior. Normal judgment and thought content.   Respiratory: Normal respiratory effort, no problems with respiration noted  Rest of physical exam deferred due to type of encounter  Labs and Imaging No results found for this or any previous visit (from the past 336 hour(s)). No results found.     Assessment and Plan:     1. Class 2 severe obesity due to excess calories with serious comorbidity and body mass index (BMI) of 36.0 to 36.9 in adult [facility]) Patient is currently on Saxenda but having trouble finding medication/pharmacy  discussed switch to medium dose wengovy and then increasing to higher doses.  Patient with currently good result from LNG medication therapy. She hast lost 11kg since June and and even great since May.   Wt Readings from Last 3 Encounters:  04/17/22 214 lb (97.1 kg)  11/21/21 238 lb (108 kg)  10/17/21 246 lb (111.6 kg)   - Semaglutide-Weight Management (WEGOVY) 1 MG/0.5ML SOAJ; Inject 1 mg into the  skin every 7 (seven) days.  Dispense: 2 mL; Refill: 1      I discussed the assessment and treatment plan with the patient. The patient was provided an opportunity to ask questions and all were answered. The patient agreed with the plan and demonstrated an understanding of the instructions.   The patient was advised to call back or seek an in-person evaluation/go to the ED if the symptoms worsen or if the condition fails to improve as anticipated.   All questions were answered.  I provided 11 minutes of face-to-face time during this encounter.   Caren Macadam, MD Center for [employer], [facility]

## 2022-04-17 NOTE — Telephone Encounter (Signed)
Spoke w/ pt about follow up appt and copay for today's visit. Pt stated she will call once she starts taking medication to schedule follow up. Pt stated she did not have her card with her and ok to bill copay.

## 2022-04-18 ENCOUNTER — Other Ambulatory Visit (HOSPITAL_COMMUNITY): Payer: Self-pay

## 2022-04-23 ENCOUNTER — Encounter: Payer: Self-pay | Admitting: *Deleted

## 2022-04-23 ENCOUNTER — Other Ambulatory Visit (HOSPITAL_COMMUNITY): Payer: Self-pay

## 2022-04-23 MED ORDER — WEGOVY 1.7 MG/0.75ML ~~LOC~~ SOAJ
1.7000 mg | SUBCUTANEOUS | 2 refills | Status: DC
  Filled 2022-04-23 – 2022-05-03 (×3): qty 3, 28d supply, fill #0
  Filled 2022-05-29: qty 3, 28d supply, fill #1
  Filled 2022-06-24: qty 3, 28d supply, fill #2

## 2022-04-23 NOTE — Addendum Note (Signed)
Addended by: Caryl Bis on: 04/23/2022 01:08 PM   Modules accepted: Orders

## 2022-04-25 ENCOUNTER — Other Ambulatory Visit (HOSPITAL_COMMUNITY): Payer: Self-pay

## 2022-05-01 ENCOUNTER — Encounter: Payer: Self-pay | Admitting: *Deleted

## 2022-05-03 ENCOUNTER — Other Ambulatory Visit (HOSPITAL_COMMUNITY): Payer: Self-pay

## 2022-05-29 ENCOUNTER — Other Ambulatory Visit: Payer: Self-pay

## 2022-06-12 ENCOUNTER — Telehealth: Payer: Self-pay

## 2022-06-12 ENCOUNTER — Telehealth (INDEPENDENT_AMBULATORY_CARE_PROVIDER_SITE_OTHER): Payer: No Typology Code available for payment source | Admitting: Family Medicine

## 2022-06-12 DIAGNOSIS — Z6835 Body mass index (BMI) 35.0-35.9, adult: Secondary | ICD-10-CM

## 2022-06-12 DIAGNOSIS — E6609 Other obesity due to excess calories: Secondary | ICD-10-CM

## 2022-06-12 NOTE — Telephone Encounter (Signed)
Left message for pt to call office back regarding her co pay due for today's visit. Pt was seen via Mychart

## 2022-06-12 NOTE — Progress Notes (Signed)
GYNECOLOGY VIRTUAL VISIT ENCOUNTER NOTE  Provider location: [facility]   Patient location: Home  I connected with [name] on 06/12/22 at  3:50 PM EST by MyChart Video Encounter and verified that I am speaking with the correct person using two identifiers.   I discussed the limitations, risks, security and privacy concerns of performing an evaluation and management service virtually and the availability of in person appointments. I also discussed with the patient that there may be a patient responsible charge related to this service. The patient expressed understanding and agreed to proceed.   History:  Savannah Holland is a 36 y.o. G75P1001 female being evaluated today for weight loss. On Semaglutide. Going well. She is having less constipation. Down to 202 lbs today. She denies any abnormal vaginal discharge, bleeding, pelvic pain or other concerns.       Past Medical History:  Diagnosis Date   Allergy    GERD (gastroesophageal reflux disease)    Pulmonary embolism (HCC) 2018   finished 3 month course of Xarelto   Past Surgical History:  Procedure Laterality Date   EYE SURGERY Right    Rheg RD repair - Dr. Rennis Chris   GAS INSERTION Right 06/22/2020   Procedure: INSERTION OF GAS - C3F8;  Surgeon: Rennis Chris, MD;  Location: [facility] OR;  Service: Ophthalmology;  Laterality: Right;   GAS/FLUID EXCHANGE Right 06/22/2020   Procedure: GAS/FLUID EXCHANGE;  Surgeon: Rennis Chris, MD;  Location: [facility] OR;  Service: Ophthalmology;  Laterality: Right;   LASER PHOTO ABLATION Left 06/22/2020   Procedure: LASER PHOTO ABLATION;  Surgeon: Rennis Chris, MD;  Location: [facility] OR;  Service: Ophthalmology;  Laterality: Left;   NO PAST SURGERIES     PARS PLANA VITRECTOMY Right 06/22/2020   Procedure: PARS PLANA VITRECTOMY WITH 25 GAUGE;  Surgeon: Rennis Chris, MD;  Location: [facility] OR;  Service: Ophthalmology;  Laterality: Right;   PHOTOCOAGULATION WITH LASER Right  06/22/2020   Procedure: PHOTOCOAGULATION WITH LASER;  Surgeon: Rennis Chris, MD;  Location: [facility] OR;  Service: Ophthalmology;  Laterality: Right;   RETINAL DETACHMENT SURGERY Right    Dr. Rennis Chris   The following portions of the patient's history were reviewed and updated as appropriate: allergies, current medications, past family history, past medical history, past social history, past surgical history and problem list.   Health Maintenance:  Normal pap and negative HRHPV on 11/21/2021.   Review of Systems:  Pertinent items noted in HPI and remainder of comprehensive ROS otherwise negative.  Physical Exam:   General:  Alert, oriented and cooperative. Patient appears to be in no acute distress.  Mental Status: Normal mood and affect. Normal behavior. Normal judgment and thought content.   Respiratory: Normal respiratory effort, no problems with respiration noted  Rest of physical exam deferred due to type of encounter  Labs and Imaging No results found for this or any previous visit (from the past 336 hour(s)). No results found.     Assessment and Plan:     1. Class 2 obesity due to excess calories without serious comorbidity with body mass index (BMI) of 35.0 to 35.9 in adult Continue Wegovy. She is down 48 lbs. Continue diet and exercise and healthy lifestyle habits.       I discussed the assessment and treatment plan with the patient. The patient was provided an opportunity to ask questions and all were answered. The patient agreed with the plan and demonstrated an understanding of the instructions.  The patient was advised to call back or seek an in-person evaluation/go to the ED if the symptoms worsen or if the condition fails to improve as anticipated.  I provided 11 minutes of face-to-face time during this encounter.   Reva Bores, MD [facility] [employer], [facility]

## 2022-06-12 NOTE — Progress Notes (Signed)
Virtual Visit to F/U on medication.   Pt states everything is going well and she has no concerns.

## 2022-06-18 NOTE — Progress Notes (Signed)
[facility] - Clinic Note  06/24/2022    CHIEF COMPLAINT Patient presents for Retina Follow Up   HISTORY OF PRESENT ILLNESS: Savannah Holland is a 37 y.o. female who presents to the clinic today for:   HPI     Retina Follow Up   Patient presents with  Retinal Break/Detachment.  In right eye.  Severity is moderate.  Duration of 1 year.  Since onset it is stable.  I, the attending physician,  performed the HPI with the patient and updated documentation appropriately.        Comments   Pt here for 1 year ret f/u RD OD. Pt states VA the same, no new floaters-still seeing some intermittently after surgery. No FOL.       Last edited by Rennis Chris, MD on 06/24/2022  8:58 AM.    Pt states she has not noticed anything different with her vision, she still has occasional floaters  Referring physician: Reva Bores, MD [address] [place],  [place] [address]  HISTORICAL INFORMATION:   Selected notes from the MEDICAL RECORD NUMBER Referred by Dr. Renold Don ([facility] [address]) for eval of Retinoschisis OD   CURRENT MEDICATIONS: No current outpatient medications on file. (Ophthalmic Drugs)   No current facility-administered medications for this visit. (Ophthalmic Drugs)   Current Outpatient Medications (Other)  Medication Sig   Insulin Pen Needle 32G X 4 MM MISC Use as  directed.   levonorgestrel (MIRENA) 20 MCG/24HR IUD 1 each by Intrauterine route once.   Semaglutide-Weight Management (WEGOVY) 1.7 MG/0.75ML SOAJ Inject 1.7 mg into the skin once a week.   No current facility-administered medications for this visit. (Other)   REVIEW OF SYSTEMS: ROS   Positive for: Eyes Negative for: Constitutional, Gastrointestinal, Neurological, Skin, Genitourinary, Musculoskeletal, HENT, Endocrine, Cardiovascular, Respiratory, Psychiatric, Allergic/Imm, Heme/Lymph Last edited by Thompson Grayer, COT on 06/24/2022  8:20 AM.     ALLERGIES No Known  Allergies  PAST MEDICAL HISTORY Past Medical History:  Diagnosis Date   Allergy    GERD (gastroesophageal reflux disease)    Pulmonary embolism (HCC) 2018   finished 3 month course of Xarelto   Past Surgical History:  Procedure Laterality Date   EYE SURGERY Right    Rheg RD repair - Dr. Rennis Chris   GAS INSERTION Right 06/22/2020   Procedure: INSERTION OF GAS - C3F8;  Surgeon: Rennis Chris, MD;  Location: [facility] OR;  Service: Ophthalmology;  Laterality: Right;   GAS/FLUID EXCHANGE Right 06/22/2020   Procedure: GAS/FLUID EXCHANGE;  Surgeon: Rennis Chris, MD;  Location: [facility] OR;  Service: Ophthalmology;  Laterality: Right;   LASER PHOTO ABLATION Left 06/22/2020   Procedure: LASER PHOTO ABLATION;  Surgeon: Rennis Chris, MD;  Location: [facility] OR;  Service: Ophthalmology;  Laterality: Left;   NO PAST SURGERIES     PARS PLANA VITRECTOMY Right 06/22/2020   Procedure: PARS PLANA VITRECTOMY WITH 25 GAUGE;  Surgeon: Rennis Chris, MD;  Location: [facility] OR;  Service: Ophthalmology;  Laterality: Right;   PHOTOCOAGULATION WITH LASER Right 06/22/2020   Procedure: PHOTOCOAGULATION WITH LASER;  Surgeon: Rennis Chris, MD;  Location: [facility] OR;  Service: Ophthalmology;  Laterality: Right;   RETINAL DETACHMENT SURGERY Right    Dr. Rennis Chris   FAMILY HISTORY Family History  Problem Relation Age of Onset   Fibroids Mother    Hyperlipidemia Father    GER disease Father    Prostate cancer Father    Breast cancer Maternal  Aunt    Stroke Maternal Grandmother    Diabetes Maternal Grandmother    Frontotemporal dementia Maternal Grandmother    Diabetes Paternal Grandmother    SOCIAL HISTORY Social History   Tobacco Use   Smoking status: Never   Smokeless tobacco: Never  Vaping Use   Vaping Use: Never used  Substance Use Topics   Alcohol use: Yes    Comment: rare   Drug use: No       OPHTHALMIC EXAM:  Base Eye Exam     Visual Acuity (Snellen - Linear)       Right Left   Dist cc 20/30 20/20   Dist ph  cc 20/20 -2     Correction: Glasses         Tonometry (Tonopen, 8:24 AM)       Right Left   Pressure 12 15         Pupils       Pupils Dark Light Shape React APD   Right PERRL 5 4 Round Brisk None   Left PERRL 5 4 Round Brisk None         Visual Fields (Counting fingers)       Left Right    Full Full         Extraocular Movement       Right Left    Full, Ortho Full, Ortho         Neuro/Psych     Oriented x3: Yes   Mood/Affect: Normal         Dilation     Both eyes: 1.0% Mydriacyl, 2.5% Phenylephrine @ 8:25 AM           Slit Lamp and Fundus Exam     Slit Lamp Exam       Right Left   Lids/Lashes Normal Normal   Conjunctiva/Sclera White and quiet White and quiet   Cornea 1+ fine Punctate epithelial erosions, tear film debris Trace Punctate epithelial erosions   Anterior Chamber deep and clear Deep and quiet   Iris Round and dilated Round and dilated   Lens 2+ Posterior subcapsular cataract with pigment deposition, Trace Cortical cataract Clear   Anterior Vitreous post vitrectomy; clear Vitreous syneresis, no pigment         Fundus Exam       Right Left   Disc Pink and Sharp, Tilted disc, temporal PPA Pink and Sharp, mildly Tilted disc, temporal PPA, Compact   C/D Ratio 0.3 0.2   Macula Flat, good foveal reflex, mild RPE mottling, mild ERM, No heme or edema Flat, Good foveal reflex, mild RPE mottling, No heme or edema   Vessels mild attenuation mild attenuation, mild tortuosity   Periphery retina attached; IT retinotomy with good laser changes; good 360 laser; ORIGINALLY: Persistent SRF IT periphery (extending beyond laser barricade); shallow SRF temporal and superior periphery; lattice with round, atrophic holes at 1030 and 1045 within detachment, pigmented VR tuft at 0730 outside of detachment, no new RT/RD Attached, focal pigmented lattice IT quad at 0400, 0430 (with atrophic holes) and 0500 -- good laser in place, No new RT/RD/lattice            Refraction     Wearing Rx       Sphere Cylinder Axis Add   Right -8.25 +0.50 055    Left -8.25 +0.50 004          Wearing Rx #2       Sphere Cylinder Axis Add  Right -8.50 +0.25 080 +0.00   Left -8.75 +0.25 106 +0.00    Type: CL           IMAGING AND PROCEDURES  Imaging and Procedures for 06/24/2022  OCT, Retina - OU - Both Eyes       Right Eye Quality was borderline. Central Foveal Thickness: 301. Progression has worsened. Findings include no IRF, no SRF, abnormal foveal contour, myopic contour, epiretinal membrane (ERM with sharpening of nasal foveal contour).   Left Eye Quality was good. Central Foveal Thickness: 259. Progression has been stable. Findings include normal foveal contour, no IRF, no SRF, myopic contour, vitreomacular adhesion .   Notes *Images captured and stored on drive  Diagnosis / Impression: OD: ERM with sharpening of nasal foveal contour OS: NFP, no IRF/SRF OS Myopic contour OU  Clinical management:  See below  Abbreviations: NFP - Normal foveal profile. CME - cystoid macular edema. PED - pigment epithelial detachment. IRF - intraretinal fluid. SRF - subretinal fluid. EZ - ellipsoid zone. ERM - epiretinal membrane. ORA - outer retinal atrophy. ORT - outer retinal tubulation. SRHM - subretinal hyper-reflective material. IRHM - intraretinal hyper-reflective material            ASSESSMENT/PLAN:    ICD-10-CM   1. 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 Retinal hole of both eyes  H33.323     4. Epiretinal membrane (ERM) of right eye  H35.371     5. Other secondary cataract of right eye  H26.491      1. Rhegmatogenous retinal detachment, OD - detached from 11 to 8 oclock, with atrophic holes at 1030 and 1045 - s/p pneumatic retinopexy OD (12.13.21) - s/p supplemental barricade laser retinopexy OD 12.20.21  - s/p laser retinopexy  w/ indirect ophthalmoscope OD 12.22.21  - s/p supplemental 100% C3F8 gas (0.1 cc), 01.03.22 - pre op exam and OCT with persistent shallow SRF in temporal periphery, greatest 8-9 oclock -- SRF extending beyond laser  - s/p 25g PPV/EL/FAX/14% C3F8 OD, 01.06.22             - doing well -- BCVA 20/20             - retina attached and in good position -- good laser changes  - +PSC OD, post vitrectomy  - f/u 1 year, RD OD, DFE, OCT  2,3. Lattice degeneration w/ atrophic holes, both eyes  - OD: lattice with atrophic holes and RD as above - OS: focal pigmented lattice IT at 0400, 0430 and 0500 --0430 with atrophic holes - s/p laser retinopexy OS (01.06.21) with good laser changes in place - no new RT/RD or lattice OU - monitor - f/u 1 year  4. Epiretinal membrane, right eye  - The natural history, anatomy, potential for loss of vision, and treatment options including vitrectomy techniques and the complications of endophthalmitis, retinal detachment, vitreous hemorrhage, cataract progression and permanent vision loss discussed with the patient. - mild ERM w/ interval sharpening of nasal foveal contour - BCVA 20/20 - no metamorphopsia - no indication for surgery at this time - monitor for now - f/u 1 year -- DFE/OCT  5. PSC Cataract OD - Not affecting vision now - monitor  Ophthalmic Meds Ordered this visit:  No orders of the defined types were placed in this encounter.    Return in about 1 year (around 06/25/2023) for f/u  RD OD, DFE, OCT.  There are no Patient Instructions on file for this visit.  Explained the diagnoses, plan, and follow up with the patient and they expressed understanding.  Patient expressed understanding of the importance of proper follow up care.   This document serves as a record of services personally performed by Gardiner Sleeper, MD, PhD. It was created on their behalf by Roselee Nova, COMT. The creation of this record is the provider's dictation and/or activities  during the visit.  Electronically signed by: Roselee Nova, COMT 06/24/22 8:59 AM  This document serves as a record of services personally performed by Gardiner Sleeper, MD, PhD. It was created on their behalf by San Jetty. Owens Shark, OA an ophthalmic technician. The creation of this record is the provider's dictation and/or activities during the visit.    Electronically signed by: San Jetty. Owens Shark, [place] 01.08.2024 8:59 AM   Gardiner Sleeper, M.D., Ph.D. Diseases & Surgery of the Retina and Vitreous [facility]  I have reviewed the above documentation for accuracy and completeness, and I agree with the above. Gardiner Sleeper, M.D., Ph.D. 06/24/22 9:01 AM  Abbreviations: M myopia (nearsighted); A astigmatism; H hyperopia (farsighted); P presbyopia; Mrx spectacle prescription;  CTL contact lenses; OD right eye; OS left eye; OU both eyes  XT exotropia; ET esotropia; PEK punctate epithelial keratitis; PEE punctate epithelial erosions; DES dry eye syndrome; MGD meibomian gland dysfunction; ATs artificial tears; PFAT's preservative free artificial tears; [place] nuclear sclerotic cataract; PSC posterior subcapsular cataract; ERM epi-retinal membrane; PVD posterior vitreous detachment; RD retinal detachment; DM diabetes mellitus; DR diabetic retinopathy; NPDR non-proliferative diabetic retinopathy; PDR proliferative diabetic retinopathy; CSME clinically significant macular edema; DME diabetic macular edema; dbh dot blot hemorrhages; CWS cotton wool spot; POAG primary open angle glaucoma; C/D cup-to-disc ratio; HVF humphrey visual field; GVF goldmann visual field; OCT optical coherence tomography; IOP intraocular pressure; BRVO Branch retinal vein occlusion; CRVO central retinal vein occlusion; CRAO central retinal artery occlusion; BRAO branch retinal artery occlusion; RT retinal tear; SB scleral buckle; PPV pars plana vitrectomy; VH Vitreous hemorrhage; PRP panretinal laser photocoagulation; IVK  intravitreal kenalog; VMT vitreomacular traction; MH Macular hole;  NVD neovascularization of the disc; NVE neovascularization elsewhere; AREDS age related eye disease study; ARMD age related macular degeneration; POAG primary open angle glaucoma; EBMD epithelial/anterior basement membrane dystrophy; ACIOL anterior chamber intraocular lens; IOL intraocular lens; PCIOL posterior chamber intraocular lens; Phaco/IOL phacoemulsification with intraocular lens placement; [place] photorefractive keratectomy; LASIK laser assisted in situ keratomileusis; HTN hypertension; DM diabetes mellitus; COPD chronic obstructive pulmonary disease

## 2022-06-24 ENCOUNTER — Ambulatory Visit (INDEPENDENT_AMBULATORY_CARE_PROVIDER_SITE_OTHER): Payer: 59 | Admitting: Ophthalmology

## 2022-06-24 ENCOUNTER — Encounter: Payer: Self-pay | Admitting: Family Medicine

## 2022-06-24 ENCOUNTER — Encounter (INDEPENDENT_AMBULATORY_CARE_PROVIDER_SITE_OTHER): Payer: Self-pay | Admitting: Ophthalmology

## 2022-06-24 DIAGNOSIS — H35413 Lattice degeneration of retina, bilateral: Secondary | ICD-10-CM | POA: Diagnosis not present

## 2022-06-24 DIAGNOSIS — H33323 Round hole, bilateral: Secondary | ICD-10-CM | POA: Diagnosis not present

## 2022-06-24 DIAGNOSIS — H35371 Puckering of macula, right eye: Secondary | ICD-10-CM | POA: Diagnosis not present

## 2022-06-24 DIAGNOSIS — H3321 Serous retinal detachment, right eye: Secondary | ICD-10-CM | POA: Diagnosis not present

## 2022-06-24 DIAGNOSIS — H26491 Other secondary cataract, right eye: Secondary | ICD-10-CM

## 2022-06-25 ENCOUNTER — Other Ambulatory Visit (HOSPITAL_COMMUNITY): Payer: Self-pay

## 2022-06-25 MED ORDER — WEGOVY 2.4 MG/0.75ML ~~LOC~~ SOAJ
2.4000 mg | SUBCUTANEOUS | 2 refills | Status: DC
  Filled 2022-06-25: qty 3, 28d supply, fill #0
  Filled 2022-07-25: qty 3, 28d supply, fill #1
  Filled 2022-08-16 – 2022-08-22 (×2): qty 3, 28d supply, fill #2

## 2022-08-19 ENCOUNTER — Other Ambulatory Visit (HOSPITAL_COMMUNITY): Payer: Self-pay

## 2022-08-20 ENCOUNTER — Other Ambulatory Visit (HOSPITAL_COMMUNITY): Payer: Self-pay

## 2022-08-22 ENCOUNTER — Other Ambulatory Visit (HOSPITAL_COMMUNITY): Payer: Self-pay

## 2022-09-16 ENCOUNTER — Other Ambulatory Visit: Payer: Self-pay | Admitting: Family Medicine

## 2022-09-23 ENCOUNTER — Other Ambulatory Visit (HOSPITAL_COMMUNITY): Payer: Self-pay

## 2022-09-23 ENCOUNTER — Other Ambulatory Visit: Payer: Self-pay | Admitting: Family Medicine

## 2022-09-23 ENCOUNTER — Encounter: Payer: Self-pay | Admitting: Family Medicine

## 2022-09-23 MED ORDER — INSULIN PEN NEEDLE 32G X 4 MM MISC
1.0000 | 0 refills | Status: DC
  Filled 2022-09-23: qty 100, 90d supply, fill #0

## 2022-09-23 MED ORDER — WEGOVY 2.4 MG/0.75ML ~~LOC~~ SOAJ
2.4000 mg | SUBCUTANEOUS | 2 refills | Status: DC
  Filled 2022-09-23: qty 3, 28d supply, fill #0

## 2022-09-26 ENCOUNTER — Other Ambulatory Visit (HOSPITAL_COMMUNITY): Payer: Self-pay

## 2022-10-10 ENCOUNTER — Other Ambulatory Visit: Payer: Self-pay

## 2022-12-11 ENCOUNTER — Encounter: Payer: Self-pay | Admitting: Family Medicine

## 2022-12-11 ENCOUNTER — Ambulatory Visit (INDEPENDENT_AMBULATORY_CARE_PROVIDER_SITE_OTHER): Payer: 59 | Admitting: Family Medicine

## 2022-12-11 VITALS — BP 108/74 | HR 98 | Ht 64.5 in | Wt 178.0 lb

## 2022-12-11 DIAGNOSIS — Z1339 Encounter for screening examination for other mental health and behavioral disorders: Secondary | ICD-10-CM | POA: Diagnosis not present

## 2022-12-11 DIAGNOSIS — Z01419 Encounter for gynecological examination (general) (routine) without abnormal findings: Secondary | ICD-10-CM | POA: Diagnosis not present

## 2022-12-11 NOTE — Progress Notes (Unsigned)
Patient presents for Annual.  Pt states would like Annual labs/Vitamin D labs drawn  LMP: 11/16/2022 Monthly Moderate flow  Last pap: Date: 11/21/2021 WNL   pt ok with not doing pap today. Contraception: IUD Mirena Mammogram:  06/21/2019 STD Screening: Accepts Flu Vaccine : N/A  CC: pt reports left sided hip pain unsure what it may be.  Fun Fact: Patient likes baking.

## 2022-12-11 NOTE — Progress Notes (Unsigned)
Subjective:     Savannah Holland is a 37 y.o. female and is here for a comprehensive physical exam. The patient reports {problems:16946}.   {Common ambulatory SmartLinks:19316}  Review of Systems {ros; complete:30496}   Objective:    {Exam, Complete:[phone]}    Assessment:    Healthy female exam. ***     Plan:     See After Visit Summary for Counseling Recommendations

## 2022-12-11 NOTE — Patient Instructions (Signed)

## 2022-12-12 LAB — CBC
Hematocrit: 40.3 % (ref 34.0–46.6)
Hemoglobin: 13.2 g/dL (ref 11.1–15.9)
MCH: 28 pg (ref 26.6–33.0)
MCHC: 32.8 g/dL (ref 31.5–35.7)
MCV: 85 fL (ref 79–97)
Platelets: 292 10*3/uL (ref 150–450)
RBC: 4.72 x10E6/uL (ref 3.77–5.28)
RDW: 12.7 % (ref 11.7–15.4)
WBC: 6.3 10*3/uL (ref 3.4–10.8)

## 2022-12-12 LAB — HEMOGLOBIN A1C
Est. average glucose Bld gHb Est-mCnc: 105 mg/dL
Hgb A1c MFr Bld: 5.3 % (ref 4.8–5.6)

## 2022-12-12 LAB — RPR+HBSAG+HCVAB+...
HIV Screen 4th Generation wRfx: NONREACTIVE
Hep C Virus Ab: NONREACTIVE
Hepatitis B Surface Ag: NEGATIVE
RPR Ser Ql: NONREACTIVE

## 2022-12-12 LAB — COMPREHENSIVE METABOLIC PANEL
ALT: 12 IU/L (ref 0–32)
AST: 14 IU/L (ref 0–40)
Albumin: 4.3 g/dL (ref 3.9–4.9)
Alkaline Phosphatase: 82 IU/L (ref 44–121)
BUN/Creatinine Ratio: 13 (ref 9–23)
BUN: 11 mg/dL (ref 6–20)
Bilirubin Total: 0.8 mg/dL (ref 0.0–1.2)
CO2: 21 mmol/L (ref 20–29)
Calcium: 9.3 mg/dL (ref 8.7–10.2)
Chloride: 104 mmol/L (ref 96–106)
Creatinine, Ser: 0.86 mg/dL (ref 0.57–1.00)
Globulin, Total: 2.9 g/dL (ref 1.5–4.5)
Glucose: 84 mg/dL (ref 70–99)
Potassium: 4.2 mmol/L (ref 3.5–5.2)
Sodium: 138 mmol/L (ref 134–144)
Total Protein: 7.2 g/dL (ref 6.0–8.5)
eGFR: 89 mL/min/{1.73_m2} (ref >59)

## 2022-12-12 LAB — LIPID PANEL
Chol/HDL Ratio: 3.5 ratio (ref 0.0–4.4)
Cholesterol, Total: 173 mg/dL (ref 100–199)
HDL: 49 mg/dL (ref >39)
LDL Chol Calc (NIH): 107 mg/dL — ABNORMAL HIGH (ref 0–99)
Triglycerides: 90 mg/dL (ref 0–149)
VLDL Cholesterol Cal: 17 mg/dL (ref 5–40)

## 2022-12-12 LAB — VITAMIN D 25 HYDROXY (VIT D DEFICIENCY, FRACTURES): Vit D, 25-Hydroxy: 37.7 ng/mL (ref 30.0–100.0)

## 2022-12-12 LAB — TSH: TSH: 1.18 u[IU]/mL (ref 0.450–4.500)

## 2023-06-16 NOTE — Progress Notes (Signed)
 [facility] - Clinic Note  06/24/2023    CHIEF COMPLAINT Patient presents for Retina Follow Up   HISTORY OF PRESENT ILLNESS: Savannah Holland is a 37 y.o. female who presents to the clinic today for:   HPI     Retina Follow Up   Patient presents with  Retinal Break/Detachment.  In right eye.  This started 1 year ago.  Duration of 1 year.  Since onset it is stable.  I, the attending physician,  performed the HPI with the patient and updated documentation appropriately.        Comments   1 year retina follow up RD OD pt is reporting no vision changes noticed she has some floaters at times but denies any flashes       Last edited by Valdemar Rogue, MD on 06/26/2023 10:26 PM.     Pt states she saw her regular eye dr last week, she states they did not give her a new glasses rx bc it has not changed  Referring physician: Fredirick Glenys RAMAN, MD [address] [place],  [place] [address]  HISTORICAL INFORMATION:   Selected notes from the MEDICAL RECORD NUMBER Referred by Dr. Overton ([facility], [address]) for eval of Retinoschisis OD   CURRENT MEDICATIONS: No current outpatient medications on file. (Ophthalmic Drugs)   No current facility-administered medications for this visit. (Ophthalmic Drugs)   Current Outpatient Medications (Other)  Medication Sig   Insulin  Pen Needle 32G X 4 MM MISC Use as  directed. (Patient not taking: Reported on 12/11/2022)   levonorgestrel  (MIRENA ) 20 MCG/24HR IUD 1 each by Intrauterine route once.   Semaglutide -Weight Management (WEGOVY ) 2.4 MG/0.75ML SOAJ Inject 2.4 mg into the skin once a week.   No current facility-administered medications for this visit. (Other)   REVIEW OF SYSTEMS: ROS   Positive for: Eyes Negative for: Constitutional, Gastrointestinal, Neurological, Skin, Genitourinary, Musculoskeletal, HENT, Endocrine, Cardiovascular, Respiratory, Psychiatric, Allergic/Imm, Heme/Lymph Last edited by Resa Delon ORN, COT on 06/24/2023  8:10 AM.      ALLERGIES No Known Allergies  PAST MEDICAL HISTORY Past Medical History:  Diagnosis Date   Allergy    GERD (gastroesophageal reflux disease)    Pulmonary embolism (HCC) 2018   finished 3 month course of Xarelto    Past Surgical History:  Procedure Laterality Date   EYE SURGERY Right    Rheg RD repair - Dr. Rogue Valdemar   GAS INSERTION Right 06/22/2020   Procedure: INSERTION OF GAS - C3F8;  Surgeon: Valdemar Rogue, MD;  Location: [facility] OR;  Service: Ophthalmology;  Laterality: Right;   GAS/FLUID EXCHANGE Right 06/22/2020   Procedure: GAS/FLUID EXCHANGE;  Surgeon: Valdemar Rogue, MD;  Location: [facility] OR;  Service: Ophthalmology;  Laterality: Right;   LASER PHOTO ABLATION Left 06/22/2020   Procedure: LASER PHOTO ABLATION;  Surgeon: Valdemar Rogue, MD;  Location: [facility] OR;  Service: Ophthalmology;  Laterality: Left;   NO PAST SURGERIES     PARS PLANA VITRECTOMY Right 06/22/2020   Procedure: PARS PLANA VITRECTOMY WITH 25 GAUGE;  Surgeon: Valdemar Rogue, MD;  Location: [facility] OR;  Service: Ophthalmology;  Laterality: Right;   PHOTOCOAGULATION WITH LASER Right 06/22/2020   Procedure: PHOTOCOAGULATION WITH LASER;  Surgeon: Valdemar Rogue, MD;  Location: [facility] OR;  Service: Ophthalmology;  Laterality: Right;   RETINAL DETACHMENT SURGERY Right    Dr. Rogue Valdemar   FAMILY HISTORY Family History  Problem Relation Age of Onset   Fibroids Mother    Hyperlipidemia  Father    GER disease Father    Prostate cancer Father    Breast cancer Maternal Aunt    Stroke Maternal Grandmother    Diabetes Maternal Grandmother    Frontotemporal dementia Maternal Grandmother    Diabetes Paternal Grandmother    SOCIAL HISTORY Social History   Tobacco Use   Smoking status: Never   Smokeless tobacco: Never  Vaping Use   Vaping status: Never Used  Substance Use Topics   Alcohol use: Yes    Comment: rare   Drug use: No       OPHTHALMIC EXAM:  Base Eye Exam     Visual Acuity  (Snellen - Linear)       Right Left   Dist cc 20/25 -2 20/20   Dist ph cc NI NI    Correction: Contacts         Tonometry (Tonopen, 8:18 AM)       Right Left   Pressure 12 14         Pupils       Pupils Dark Light Shape React APD   Right PERRL 5 4 Round Brisk None   Left PERRL 5 4 Round Brisk None         Visual Fields       Left Right    Full Full         Extraocular Movement       Right Left    Full, Ortho Full, Ortho         Neuro/Psych     Oriented x3: Yes   Mood/Affect: Normal         Dilation     Both eyes: 2.5% Phenylephrine  @ 8:18 AM           Slit Lamp and Fundus Exam     Slit Lamp Exam       Right Left   Lids/Lashes Normal Normal   Conjunctiva/Sclera White and quiet White and quiet   Cornea 1+ fine Punctate epithelial erosions, tear film debris mild tear film debris   Anterior Chamber deep and clear Deep and quiet   Iris Round and dilated Round and dilated   Lens 2+ Posterior subcapsular cataract with pigment deposition, Trace Cortical cataract Clear   Anterior Vitreous post vitrectomy; clear Vitreous syneresis, no pigment         Fundus Exam       Right Left   Disc Pink and Sharp, Tilted disc, temporal PPA Pink and Sharp, mildly Tilted disc, temporal PPA, Compact   C/D Ratio 0.2 0.2   Macula Flat, blunted foveal reflex, mild RPE mottling, ERM with trace cystic changes centrally, No heme or edema Flat, Good foveal reflex, mild RPE mottling, No heme or edema   Vessels mild attenuation mild attenuation, mild tortuosity   Periphery retina attached; IT retinotomy with good laser changes; good 360 laser; ORIGINALLY: Persistent SRF IT periphery (extending beyond laser barricade); shallow SRF temporal and superior periphery; lattice with round, atrophic holes at 1030 and 1045 within detachment, pigmented VR tuft at 0730 outside of detachment, no new RT/RD Attached, focal pigmented lattice IT quad at 0400, 0430 (with atrophic  holes) and 0500 -- good laser in place, No new RT/RD/lattice           Refraction     Wearing Rx       Sphere Cylinder Axis Add   Right -8.25 +0.50 055    Left -8.25 +0.50 004  Wearing Rx #2       Sphere Cylinder Axis Add   Right -8.50 +0.25 080 +0.00   Left -8.75 +0.25 106 +0.00    Type: CL           IMAGING AND PROCEDURES  Imaging and Procedures for 06/24/2023  OCT, Retina - OU - Both Eyes       Right Eye Quality was borderline. Central Foveal Thickness: 345. Progression has worsened. Findings include no SRF, abnormal foveal contour, myopic contour, epiretinal membrane, intraretinal fluid (ERM with sharpening of nasal foveal contour and now with central cystic changes / schisis).   Left Eye Quality was good. Central Foveal Thickness: 262. Progression has been stable. Findings include normal foveal contour, no IRF, no SRF, myopic contour, vitreomacular adhesion .   Notes *Images captured and stored on drive  Diagnosis / Impression: OD: ERM with sharpening of nasal foveal contour and now with central cystic changes / schisis OS: NFP, no IRF/SRF OS Myopic contour OU  Clinical management:  See below  Abbreviations: NFP - Normal foveal profile. CME - cystoid macular edema. PED - pigment epithelial detachment. IRF - intraretinal fluid. SRF - subretinal fluid. EZ - ellipsoid zone. ERM - epiretinal membrane. ORA - outer retinal atrophy. ORT - outer retinal tubulation. SRHM - subretinal hyper-reflective material. IRHM - intraretinal hyper-reflective material            ASSESSMENT/PLAN:    ICD-10-CM   1. Epiretinal membrane (ERM) of right eye  H35.371 OCT, Retina - OU - Both Eyes    2. 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 Retinal hole of both eyes  H33.323     5. Other secondary cataract of right eye  H26.491      1. Epiretinal membrane, right eye  - mild  ERM w/ interval sharpening of nasal foveal contour and now with new cystic changes / schisis on OCT - BCVA 20/25 - no metamorphopsia - no indication for surgery at this time - monitor for now - f/u 4-6 months -- DFE/OCT  2. Rhegmatogenous retinal detachment, OD - detached from 11 to 8 oclock, with atrophic holes at 1030 and 1045 - s/p pneumatic retinopexy OD (12.13.21) - s/p supplemental barricade laser retinopexy OD 12.20.21  - s/p laser retinopexy w/ indirect ophthalmoscope OD 12.22.21  - s/p supplemental 100% C3F8 gas (0.1 cc), 01.03.22 - pre op exam and OCT with persistent shallow SRF in temporal periphery, greatest 8-9 oclock -- SRF extending beyond laser  - s/p 25g PPV/EL/FAX/14% C3F8 OD, 01.06.22             - doing well -- BCVA 20/25             - retina attached and in good position -- good laser changes  - +PSC OD, post vitrectomy  - f/u 1 year, RD OD, DFE, OCT  3,4. Lattice degeneration w/ atrophic holes, both eyes  - OD: lattice with atrophic holes and RD as above - OS: focal pigmented lattice IT at 0400, 0430 and 0500 --0430 with atrophic holes - s/p laser retinopexy OS (01.06.21) with good laser changes in place - no new RT/RD or lattice OU - monitor - f/u 1 year  5. PSC Cataract OD - Not affecting vision now - monitor  Ophthalmic Meds Ordered this visit:  No orders of the defined types were placed in this encounter.  Return for f/u 4-6 months, ERM OD, DFE, OCT.  There are no Patient Instructions on file for this visit.  Explained the diagnoses, plan, and follow up with the patient and they expressed understanding.  Patient expressed understanding of the importance of proper follow up care.   This document serves as a record of services personally performed by Redell JUDITHANN Hans, MD, PhD. It was created on their behalf by Wanda GEANNIE Keens, COT an ophthalmic technician. The creation of this record is the provider's dictation and/or activities during the visit.     Electronically signed by:  Wanda GEANNIE Keens, COT  06/26/23 10:32 PM  Redell JUDITHANN Hans, M.D., Ph.D. Diseases & Surgery of the Retina and Vitreous [facility]  I have reviewed the above documentation for accuracy and completeness, and I agree with the above. Redell JUDITHANN Hans, M.D., Ph.D. 06/26/23 10:35 PM   Abbreviations: M myopia (nearsighted); A astigmatism; H hyperopia (farsighted); P presbyopia; Mrx spectacle prescription;  CTL contact lenses; OD right eye; OS left eye; OU both eyes  XT exotropia; ET esotropia; PEK punctate epithelial keratitis; PEE punctate epithelial erosions; DES dry eye syndrome; MGD meibomian gland dysfunction; ATs artificial tears; PFAT's preservative free artificial tears; NSC nuclear sclerotic cataract; PSC posterior subcapsular cataract; ERM epi-retinal membrane; PVD posterior vitreous detachment; RD retinal detachment; DM diabetes mellitus; DR diabetic retinopathy; NPDR non-proliferative diabetic retinopathy; PDR proliferative diabetic retinopathy; CSME clinically significant macular edema; DME diabetic macular edema; dbh dot blot hemorrhages; CWS cotton wool spot; POAG primary open angle glaucoma; C/D cup-to-disc ratio; HVF humphrey visual field; GVF goldmann visual field; OCT optical coherence tomography; IOP intraocular pressure; BRVO Branch retinal vein occlusion; CRVO central retinal vein occlusion; CRAO central retinal artery occlusion; BRAO branch retinal artery occlusion; RT retinal tear; SB scleral buckle; PPV pars plana vitrectomy; VH Vitreous hemorrhage; PRP panretinal laser photocoagulation; IVK intravitreal kenalog ; VMT vitreomacular traction; MH Macular hole;  NVD neovascularization of the disc; NVE neovascularization elsewhere; AREDS age related eye disease study; ARMD age related macular degeneration; POAG primary open angle glaucoma; EBMD epithelial/anterior basement membrane dystrophy; ACIOL anterior chamber intraocular lens; IOL  intraocular lens; PCIOL posterior chamber intraocular lens; Phaco/IOL phacoemulsification with intraocular lens placement; PRK photorefractive keratectomy; LASIK laser assisted in situ keratomileusis; HTN hypertension; DM diabetes mellitus; COPD chronic obstructive pulmonary disease

## 2023-06-24 ENCOUNTER — Encounter (INDEPENDENT_AMBULATORY_CARE_PROVIDER_SITE_OTHER): Payer: Self-pay | Admitting: Ophthalmology

## 2023-06-24 ENCOUNTER — Ambulatory Visit (INDEPENDENT_AMBULATORY_CARE_PROVIDER_SITE_OTHER): Payer: Managed Care, Other (non HMO) | Admitting: Ophthalmology

## 2023-06-24 DIAGNOSIS — H33323 Round hole, bilateral: Secondary | ICD-10-CM | POA: Diagnosis not present

## 2023-06-24 DIAGNOSIS — H26491 Other secondary cataract, right eye: Secondary | ICD-10-CM | POA: Diagnosis not present

## 2023-06-24 DIAGNOSIS — H35371 Puckering of macula, right eye: Secondary | ICD-10-CM | POA: Diagnosis not present

## 2023-06-24 DIAGNOSIS — H3321 Serous retinal detachment, right eye: Secondary | ICD-10-CM

## 2023-06-24 DIAGNOSIS — H35413 Lattice degeneration of retina, bilateral: Secondary | ICD-10-CM

## 2023-06-26 ENCOUNTER — Encounter (INDEPENDENT_AMBULATORY_CARE_PROVIDER_SITE_OTHER): Payer: Self-pay | Admitting: Ophthalmology

## 2023-07-31 ENCOUNTER — Encounter: Payer: Self-pay | Admitting: Family Medicine

## 2023-07-31 DIAGNOSIS — E66812 Obesity, class 2: Secondary | ICD-10-CM

## 2023-08-04 ENCOUNTER — Other Ambulatory Visit (HOSPITAL_COMMUNITY): Payer: Self-pay

## 2023-08-04 MED ORDER — WEGOVY 0.25 MG/0.5ML ~~LOC~~ SOAJ
0.2500 mg | SUBCUTANEOUS | 0 refills | Status: DC
Start: 2023-08-04 — End: 2023-12-25
  Filled 2023-08-04 – 2023-09-11 (×2): qty 2, 28d supply, fill #0

## 2023-08-04 MED ORDER — INSULIN PEN NEEDLE 32G X 4 MM MISC
1.0000 | 0 refills | Status: DC
Start: 2023-08-04 — End: 2023-12-25
  Filled 2023-08-04: qty 100, 90d supply, fill #0

## 2023-08-05 ENCOUNTER — Other Ambulatory Visit (HOSPITAL_COMMUNITY): Payer: Self-pay

## 2023-08-05 ENCOUNTER — Other Ambulatory Visit: Payer: Self-pay

## 2023-08-13 ENCOUNTER — Other Ambulatory Visit (HOSPITAL_COMMUNITY): Payer: Self-pay

## 2023-08-15 ENCOUNTER — Other Ambulatory Visit (HOSPITAL_COMMUNITY): Payer: Self-pay

## 2023-09-11 ENCOUNTER — Other Ambulatory Visit (HOSPITAL_COMMUNITY): Payer: Self-pay

## 2023-10-07 NOTE — Progress Notes (Signed)
 [facility] - Clinic Note  10/21/2023    CHIEF COMPLAINT Patient presents for Retina Follow Up   HISTORY OF PRESENT ILLNESS: Savannah Holland is a 38 y.o. female who presents to the clinic today for:   HPI     Retina Follow Up   In right eye.  This started 4 months ago.  Duration of months.  Since onset it is stable.  I, the attending physician,  performed the HPI with the patient and updated documentation appropriately.        Comments   4 month retina follow up ERM OD pt is reporting no vision changes noticed she denies any flashes has some floaters       Last edited by Ronelle Coffee, MD on 10/21/2023  8:37 AM.    Pt states her vision is about the same, she feels like her vision is worse in the mornings  Referring physician: Granville Layer, MD [address] [place],  [place] [address]  HISTORICAL INFORMATION:   Selected notes from the MEDICAL RECORD NUMBER Referred by Dr. 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 (Ophthalmic Drugs)   No current facility-administered medications for this visit. (Ophthalmic Drugs)   Current Outpatient Medications (Other)  Medication Sig   Insulin  Pen Needle 32G X 4 MM MISC Use as  directed.   levonorgestrel  (MIRENA ) 20 MCG/24HR IUD 1 each by Intrauterine route once.   Semaglutide -Weight Management (WEGOVY ) 0.25 MG/0.5ML SOAJ Inject 0.25 mg into the skin once a week.   No current facility-administered medications for this visit. (Other)   REVIEW OF SYSTEMS: ROS   Positive for: Eyes Negative for: Constitutional, Gastrointestinal, Neurological, Skin, Genitourinary, Musculoskeletal, HENT, Endocrine, Cardiovascular, Respiratory, Psychiatric, Allergic/Imm, Heme/Lymph Last edited by Alise Appl, COT on 10/21/2023  7:55 AM.       ALLERGIES No Known Allergies  PAST MEDICAL HISTORY Past Medical History:   Diagnosis Date   Allergy    GERD (gastroesophageal reflux disease)    Pulmonary embolism (HCC) 2018   finished 3 month course of Xarelto    Past Surgical History:  Procedure Laterality Date   EYE SURGERY Right    Rheg RD repair - Dr. Ronelle Coffee   GAS INSERTION Right 06/22/2020   Procedure: INSERTION OF GAS - C3F8;  Surgeon: Ronelle Coffee, MD;  Location: [facility] OR;  Service: Ophthalmology;  Laterality: Right;   GAS/FLUID EXCHANGE Right 06/22/2020   Procedure: GAS/FLUID EXCHANGE;  Surgeon: Ronelle Coffee, MD;  Location: [facility] OR;  Service: Ophthalmology;  Laterality: Right;   LASER PHOTO ABLATION Left 06/22/2020   Procedure: LASER PHOTO ABLATION;  Surgeon: Ronelle Coffee, MD;  Location: [facility] OR;  Service: Ophthalmology;  Laterality: Left;   NO PAST SURGERIES     PARS PLANA VITRECTOMY Right 06/22/2020   Procedure: PARS PLANA VITRECTOMY WITH 25 GAUGE;  Surgeon: Ronelle Coffee, MD;  Location: [facility] OR;  Service: Ophthalmology;  Laterality: Right;   PHOTOCOAGULATION WITH LASER Right 06/22/2020   Procedure: PHOTOCOAGULATION WITH LASER;  Surgeon: Ronelle Coffee, MD;  Location: [facility] OR;  Service: Ophthalmology;  Laterality: Right;   RETINAL DETACHMENT SURGERY Right    Dr. Ronelle Coffee   FAMILY HISTORY Family History  Problem Relation Age of Onset   Fibroids Mother    Hyperlipidemia Father    GER disease Father    Prostate cancer Father    Breast cancer Maternal Aunt    Stroke  Maternal Grandmother    Diabetes Maternal Grandmother    Frontotemporal dementia Maternal Grandmother    Diabetes Paternal Grandmother    SOCIAL HISTORY Social History   Tobacco Use   Smoking status: Never   Smokeless tobacco: Never  Vaping Use   Vaping status: Never Used  Substance Use Topics   Alcohol use: Yes    Comment: rare   Drug use: No       OPHTHALMIC EXAM:  Base Eye Exam     Visual Acuity (Snellen - Linear)       Right Left   Dist [place] 20/30 -2 20/20   Dist ph [place] NI     Correction: Contacts          Tonometry (Tonopen, 7:59 AM)       Right Left   Pressure 9 10         Pupils       Pupils Dark Light Shape React APD   Right PERRL 5 4 Round Brisk None   Left PERRL 5 4 Round Brisk None         Visual Fields       Left Right    Full Full         Extraocular Movement       Right Left    Full, Ortho Full, Ortho         Neuro/Psych     Oriented x3: Yes   Mood/Affect: Normal         Dilation     Right eye: 2.5% Phenylephrine  @ 7:59 AM           Slit Lamp and Fundus Exam     Slit Lamp Exam       Right Left   Lids/Lashes Dermatochalasis - upper lid Normal   Conjunctiva/Sclera mild melanosis White and quiet   Cornea 1+ fine inferior Punctate epithelial erosions, tear film debris mild tear film debris   Anterior Chamber deep and clear Deep and quiet   Iris Round and dilated Round and reactive   Lens 2+ Posterior subcapsular cataract, Trace Cortical cataract Clear   Anterior Vitreous post vitrectomy; clear Vitreous syneresis, no pigment         Fundus Exam       Right Left   Disc Pink and Sharp, Tilted disc, temporal PPA    C/D Ratio 0.2 0.2   Macula Flat, blunted foveal reflex, mild RPE mottling, ERM with trace cystic changes centrally, No heme    Vessels attenuated, mild tortuosity    Periphery retina attached; IT retinotomy with good laser changes; good 360 laser; ORIGINALLY: Persistent SRF IT periphery (extending beyond laser barricade); shallow SRF temporal and superior periphery; lattice with round, atrophic holes at 1030 and 1045 within detachment, pigmented VR tuft at 0730 outside of detachment, no new RT/RD            Refraction     Wearing Rx       Sphere Cylinder Axis Add   Right -8.25 +0.50 055    Left -8.25 +0.50 004          Wearing Rx #2       Sphere Cylinder Axis Add   Right -8.50 +0.25 080 +0.00   Left -8.75 +0.25 106 +0.00    Type: CL           IMAGING AND PROCEDURES  Imaging and Procedures for  10/21/2023  OCT, Retina - OU - Both Eyes  Right Eye Quality was borderline. Central Foveal Thickness: 352. Progression has worsened. Findings include no SRF, abnormal foveal contour, myopic contour, epiretinal membrane, intraretinal fluid (ERM with sharpening of nasal foveal contour and persistent central cystic changes / schisis -- slightly increased).   Left Eye Quality was good. Central Foveal Thickness: 262. Progression has been stable. Findings include normal foveal contour, no IRF, no SRF, myopic contour, vitreomacular adhesion .   Notes *Images captured and stored on drive  Diagnosis / Impression: OD: ERM with sharpening of nasal foveal contour and persistent central cystic changes / schisis -- slightly increased OS: NFP, no IRF/SRF OS Myopic contour OU  Clinical management:  See below  Abbreviations: NFP - Normal foveal profile. CME - cystoid macular edema. PED - pigment epithelial detachment. IRF - intraretinal fluid. SRF - subretinal fluid. EZ - ellipsoid zone. ERM - epiretinal membrane. ORA - outer retinal atrophy. ORT - outer retinal tubulation. SRHM - subretinal hyper-reflective material. IRHM - intraretinal hyper-reflective material             ASSESSMENT/PLAN:    ICD-10-CM   1. Epiretinal membrane (ERM) of right eye  H35.371 OCT, Retina - OU - Both Eyes    2. 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 Retinal hole of both eyes  H33.323     5. Other secondary cataract of right eye  H26.491       1. Epiretinal membrane, right eye  - ERM with sharpening of nasal foveal contour and persistent central cystic changes / schisis -- slightly increased - BCVA decreased to 20/30 from 20/25 - no metamorphopsia - no indication for surgery at this time - monitor for now - f/u 4-6 months -- DFE/OCT  2. Rhegmatogenous retinal detachment, OD - detached from 11 to 8 oclock, with atrophic holes at 1030 and  1045 - s/p pneumatic retinopexy OD (12.13.21) - s/p supplemental barricade laser retinopexy OD 12.20.21  - s/p laser retinopexy w/ indirect ophthalmoscope OD 12.22.21  - s/p supplemental 100% C3F8 gas (0.1 cc), 01.03.22 - pre op exam and OCT with persistent shallow SRF in temporal periphery, greatest 8-9 oclock -- SRF extending beyond laser  - s/p 25g PPV/EL/FAX/14% C3F8 OD, 01.06.22             - doing well -- BCVA 20/25             - retina attached and in good position -- good laser changes  - +PSC OD, post vitrectomy  - f/u 1 year, RD OD, DFE, OCT  3,4. Lattice degeneration w/ atrophic holes, both eyes  - OD: lattice with atrophic holes and RD as above - OS: focal pigmented lattice IT at 0400, 0430 and 0500 --0430 with atrophic holes - s/p laser retinopexy OS (01.06.21) with good laser changes in place - no new RT/RD or lattice OU - monitor - f/u 1 year  5. 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 - discussed diagnosis and progression - monitor  Ophthalmic Meds Ordered this visit:  No orders of the defined types were placed in this encounter.    Return for f/u 4-6 months, ERM OD, DFE, OCT.  There are no Patient Instructions on file for this visit.  Explained the diagnoses, plan, and follow up with the patient and they expressed understanding.  Patient expressed understanding of the  importance of proper follow up care.   This document serves as a record of services personally performed by Jeanice Millard, MD, PhD. It was created on their behalf by Olene Berne, COT an ophthalmic technician. The creation of this record is the provider's dictation and/or activities during the visit.    Electronically signed by:  Olene Berne, COT  10/21/23 8:37 AM  This document serves as a record of services personally performed by Jeanice Millard, MD, PhD. It was created on  their behalf by Morley Arabia. Bevin Bucks, OA an ophthalmic technician. The creation of this record is the provider's dictation and/or activities during the visit.    Electronically signed by: Morley Arabia. Bevin Bucks, OA 10/21/23 8:37 AM  Jeanice Millard, M.D., Ph.D. Diseases & Surgery of the Retina and Vitreous [facility]   I have reviewed the above documentation for accuracy and completeness, and I agree with the above. Jeanice Millard, M.D., Ph.D. 10/21/23 8:40 AM   Abbreviations: M myopia (nearsighted); A astigmatism; H hyperopia (farsighted); P presbyopia; Mrx spectacle prescription;  CTL contact lenses; OD right eye; OS left eye; OU both eyes  XT exotropia; ET esotropia; PEK punctate epithelial keratitis; PEE punctate epithelial erosions; DES dry eye syndrome; MGD meibomian gland dysfunction; ATs artificial tears; PFAT's preservative free artificial tears; NSC nuclear sclerotic cataract; PSC posterior subcapsular cataract; ERM epi-retinal membrane; PVD posterior vitreous detachment; RD retinal detachment; DM diabetes mellitus; DR diabetic retinopathy; NPDR non-proliferative diabetic retinopathy; PDR proliferative diabetic retinopathy; CSME clinically significant macular edema; DME diabetic macular edema; dbh dot blot hemorrhages; CWS cotton wool spot; POAG primary open angle glaucoma; C/D cup-to-disc ratio; HVF humphrey visual field; GVF goldmann visual field; OCT optical coherence tomography; IOP intraocular pressure; BRVO Branch retinal vein occlusion; CRVO central retinal vein occlusion; CRAO central retinal artery occlusion; BRAO branch retinal artery occlusion; RT retinal tear; SB scleral buckle; PPV pars plana vitrectomy; VH Vitreous hemorrhage; PRP panretinal laser photocoagulation; IVK intravitreal kenalog ; VMT vitreomacular traction; MH Macular hole;  NVD neovascularization of the disc; NVE neovascularization elsewhere; AREDS age related eye disease study; ARMD age related macular  degeneration; POAG primary open angle glaucoma; EBMD epithelial/anterior basement membrane dystrophy; ACIOL anterior chamber intraocular lens; IOL intraocular lens; PCIOL posterior chamber intraocular lens; Phaco/IOL phacoemulsification with intraocular lens placement; PRK photorefractive keratectomy; LASIK laser assisted in situ keratomileusis; HTN hypertension; DM diabetes mellitus; COPD chronic obstructive pulmonary disease

## 2023-10-21 ENCOUNTER — Encounter (INDEPENDENT_AMBULATORY_CARE_PROVIDER_SITE_OTHER): Payer: Self-pay | Admitting: Ophthalmology

## 2023-10-21 ENCOUNTER — Ambulatory Visit (INDEPENDENT_AMBULATORY_CARE_PROVIDER_SITE_OTHER): Payer: Managed Care, Other (non HMO) | Admitting: Ophthalmology

## 2023-10-21 DIAGNOSIS — H35413 Lattice degeneration of retina, bilateral: Secondary | ICD-10-CM | POA: Diagnosis not present

## 2023-10-21 DIAGNOSIS — H26491 Other secondary cataract, right eye: Secondary | ICD-10-CM

## 2023-10-21 DIAGNOSIS — H33323 Round hole, bilateral: Secondary | ICD-10-CM

## 2023-10-21 DIAGNOSIS — H3321 Serous retinal detachment, right eye: Secondary | ICD-10-CM | POA: Diagnosis not present

## 2023-10-21 DIAGNOSIS — H35371 Puckering of macula, right eye: Secondary | ICD-10-CM | POA: Diagnosis not present

## 2023-12-25 ENCOUNTER — Ambulatory Visit (INDEPENDENT_AMBULATORY_CARE_PROVIDER_SITE_OTHER): Admitting: Family Medicine

## 2023-12-25 VITALS — BP 123/81 | HR 84 | Ht 64.0 in | Wt 239.0 lb

## 2023-12-25 DIAGNOSIS — R0683 Snoring: Secondary | ICD-10-CM

## 2023-12-25 DIAGNOSIS — Z01419 Encounter for gynecological examination (general) (routine) without abnormal findings: Secondary | ICD-10-CM

## 2023-12-25 NOTE — Progress Notes (Signed)
 Subjective:     Savannah Holland is a 38 y.o. female and is here for a comprehensive physical exam. The patient reports no problems. She is snoring and has insomnia and daytime sleepiness. She is concerned about sleep apnea.  The following portions of the patient's history were reviewed and updated as appropriate: allergies, current medications, past family history, past medical history, past social history, past surgical history, and problem list.  Review of Systems Pertinent items noted in HPI and remainder of comprehensive ROS otherwise negative.   Objective:    BP 123/81   Pulse 84   Ht 5' 4 (1.626 m)   Wt 239 lb (108.4 kg)   LMP 12/15/2023 (Approximate)   BMI 41.02 kg/m  General appearance: alert, cooperative, and appears stated age Head: Normocephalic, without obvious abnormality, atraumatic Neck: no adenopathy, supple, symmetrical, trachea midline, and thyroid not enlarged, symmetric, no tenderness/mass/nodules Lungs: clear to auscultation bilaterally Heart: regular rate and rhythm, S1, S2 normal, no murmur, click, rub or gallop Abdomen: soft, non-tender; bowel sounds normal; no masses,  no organomegaly Extremities: extremities normal, atraumatic, no cyanosis or edema Skin: Skin color, texture, turgor normal. No rashes or lesions Neurologic: Grossly normal    Assessment:    Healthy female exam.      Plan:  Encounter for gynecological examination without abnormal finding - pap, mammogram and IUD change due next year. - Plan: TSH, Follicle stimulating hormone, Hemoglobin A1c, CBC, Comprehensive metabolic panel with GFR, Lipid panel, VITAMIN D  25 Hydroxy (Vit-D Deficiency, Fractures)  Snoring - Home sleep study - Plan: Home sleep test    See After Visit Summary for Counseling Recommendations

## 2023-12-25 NOTE — Patient Instructions (Signed)

## 2023-12-25 NOTE — Progress Notes (Signed)
 Patient presents for Annual.  LMP: No LMP recorded.  Last pap: Date: 2023-WNL Contraception: IUD: Mirena -06/18/2016 Mammogram: Turns 39- 12/14/24 STD Screening: Declines Flu Vaccine : N/A  CC: Annual  Discuss Trouble sleeping at night- wondering about sleep apnea

## 2023-12-26 ENCOUNTER — Ambulatory Visit: Payer: Self-pay | Admitting: Family Medicine

## 2023-12-26 LAB — HEMOGLOBIN A1C
Est. average glucose Bld gHb Est-mCnc: 103 mg/dL
Hgb A1c MFr Bld: 5.2 % (ref 4.8–5.6)

## 2023-12-26 LAB — COMPREHENSIVE METABOLIC PANEL WITH GFR
ALT: 12 IU/L (ref 0–32)
AST: 17 IU/L (ref 0–40)
Albumin: 4.3 g/dL (ref 3.9–4.9)
Alkaline Phosphatase: 105 IU/L (ref 44–121)
BUN/Creatinine Ratio: 11 (ref 9–23)
BUN: 9 mg/dL (ref 6–20)
Bilirubin Total: 0.9 mg/dL (ref 0.0–1.2)
CO2: 20 mmol/L (ref 20–29)
Calcium: 9.5 mg/dL (ref 8.7–10.2)
Chloride: 102 mmol/L (ref 96–106)
Creatinine, Ser: 0.81 mg/dL (ref 0.57–1.00)
Globulin, Total: 3 g/dL (ref 1.5–4.5)
Glucose: 87 mg/dL (ref 70–99)
Potassium: 4.4 mmol/L (ref 3.5–5.2)
Sodium: 137 mmol/L (ref 134–144)
Total Protein: 7.3 g/dL (ref 6.0–8.5)
eGFR: 95 mL/min/1.73 (ref >59)

## 2023-12-26 LAB — CBC
Hematocrit: 40.7 % (ref 34.0–46.6)
Hemoglobin: 13 g/dL (ref 11.1–15.9)
MCH: 28.2 pg (ref 26.6–33.0)
MCHC: 31.9 g/dL (ref 31.5–35.7)
MCV: 88 fL (ref 79–97)
Platelets: 290 x10E3/uL (ref 150–450)
RBC: 4.61 x10E6/uL (ref 3.77–5.28)
RDW: 13.1 % (ref 11.7–15.4)
WBC: 4.7 x10E3/uL (ref 3.4–10.8)

## 2023-12-26 LAB — LIPID PANEL
Chol/HDL Ratio: 3 ratio (ref 0.0–4.4)
Cholesterol, Total: 206 mg/dL — ABNORMAL HIGH (ref 100–199)
HDL: 69 mg/dL (ref >39)
LDL Chol Calc (NIH): 122 mg/dL — ABNORMAL HIGH (ref 0–99)
Triglycerides: 84 mg/dL (ref 0–149)
VLDL Cholesterol Cal: 15 mg/dL (ref 5–40)

## 2023-12-26 LAB — FOLLICLE STIMULATING HORMONE: FSH: 5.7 m[IU]/mL

## 2023-12-26 LAB — VITAMIN D 25 HYDROXY (VIT D DEFICIENCY, FRACTURES): Vit D, 25-Hydroxy: 32.6 ng/mL (ref 30.0–100.0)

## 2023-12-26 LAB — TSH: TSH: 0.785 u[IU]/mL (ref 0.450–4.500)

## 2024-02-12 NOTE — Progress Notes (Signed)
 [facility] - Clinic Note  02/17/2024    CHIEF COMPLAINT Patient presents for Retina Follow Up   HISTORY OF PRESENT ILLNESS: Savannah Holland is a 38 y.o. female who presents to the clinic today for:   HPI     Retina Follow Up   Patient presents with  Other.  In right eye.  Severity is moderate.  Duration of 4 months.  Since onset it is stable.  I, the attending physician,  performed the HPI with the patient and updated documentation appropriately.        Comments   4 month Retina eval. Patient states no changes noticed      Last edited by Valdemar Rogue, MD on 02/19/2024  7:15 PM.     Pt states she's experiencing some glare but VA seems stable.   Referring physician: Fredirick Glenys RAMAN, MD [address] [place],  [place] [address]  HISTORICAL INFORMATION:   Selected notes from the MEDICAL RECORD NUMBER Referred by Dr. Overton ([facility], [address]) for eval of Retinoschisis OD   CURRENT MEDICATIONS: No current outpatient medications on file. (Ophthalmic Drugs)   No current facility-administered medications for this visit. (Ophthalmic Drugs)   Current Outpatient Medications (Other)  Medication Sig   levonorgestrel  (MIRENA ) 20 MCG/24HR IUD 1 each by Intrauterine route once.   No current facility-administered medications for this visit. (Other)   REVIEW OF SYSTEMS: ROS   Positive for: Eyes Negative for: Constitutional, Gastrointestinal, Neurological, Skin, Genitourinary, Musculoskeletal, HENT, Endocrine, Cardiovascular, Respiratory, Psychiatric, Allergic/Imm, Heme/Lymph Last edited by German Olam BRAVO, COT on 02/17/2024  7:42 AM.     ALLERGIES No Known Allergies  PAST MEDICAL HISTORY Past Medical History:  Diagnosis Date   Allergy    GERD (gastroesophageal reflux disease)    Pulmonary embolism (HCC) 2018   finished 3 month course of Xarelto    Past Surgical History:  Procedure Laterality Date   EYE SURGERY Right    Rheg RD repair  - Dr. Rogue Valdemar   GAS INSERTION Right 06/22/2020   Procedure: INSERTION OF GAS - C3F8;  Surgeon: Valdemar Rogue, MD;  Location: [facility] OR;  Service: Ophthalmology;  Laterality: Right;   GAS/FLUID EXCHANGE Right 06/22/2020   Procedure: GAS/FLUID EXCHANGE;  Surgeon: Valdemar Rogue, MD;  Location: [facility] OR;  Service: Ophthalmology;  Laterality: Right;   LASER PHOTO ABLATION Left 06/22/2020   Procedure: LASER PHOTO ABLATION;  Surgeon: Valdemar Rogue, MD;  Location: [facility] OR;  Service: Ophthalmology;  Laterality: Left;   NO PAST SURGERIES     PARS PLANA VITRECTOMY Right 06/22/2020   Procedure: PARS PLANA VITRECTOMY WITH 25 GAUGE;  Surgeon: Valdemar Rogue, MD;  Location: [facility] OR;  Service: Ophthalmology;  Laterality: Right;   PHOTOCOAGULATION WITH LASER Right 06/22/2020   Procedure: PHOTOCOAGULATION WITH LASER;  Surgeon: Valdemar Rogue, MD;  Location: [facility] OR;  Service: Ophthalmology;  Laterality: Right;   RETINAL DETACHMENT SURGERY Right    Dr. Rogue Valdemar   FAMILY HISTORY Family History  Problem Relation Age of Onset   Fibroids Mother    Hyperlipidemia Father    GER disease Father    Prostate cancer Father    Breast cancer Maternal Aunt    Stroke Maternal Grandmother    Diabetes Maternal Grandmother    Frontotemporal dementia Maternal Grandmother    Diabetes Paternal Grandmother    SOCIAL HISTORY Social History   Tobacco Use   Smoking status: Never   Smokeless tobacco: Never  Vaping Use  Vaping status: Never Used  Substance Use Topics   Alcohol use: Yes    Comment: rare   Drug use: No       OPHTHALMIC EXAM:  Base Eye Exam     Visual Acuity (Snellen - Linear)       Right Left   Dist cc 20/30 20/20   Dist ph cc 20/25     Correction: Contacts         Tonometry (Tonopen, 7:48 AM)       Right Left   Pressure 16 16         Pupils       Dark Light Shape React APD   Right 3 2 Round Brisk None   Left 3 2 Round Brisk None         Visual Fields (Counting fingers)       Left  Right    Full Full         Extraocular Movement       Right Left    Full, Ortho Full, Ortho         Neuro/Psych     Oriented x3: Yes   Mood/Affect: Normal         Dilation     Both eyes: 1.0% Mydriacyl , 2.5% Phenylephrine  @ 7:48 AM           Slit Lamp and Fundus Exam     Slit Lamp Exam       Right Left   Lids/Lashes Dermatochalasis - upper lid Normal   Conjunctiva/Sclera mild melanosis White and quiet   Cornea 1+ fine inferior Punctate epithelial erosions, tear film debris mild tear film debris   Anterior Chamber deep and clear Deep and quiet   Iris Round and dilated Round and reactive   Lens 2+ Posterior subcapsular cataract, Trace Cortical cataract Clear   Anterior Vitreous post vitrectomy; clear Vitreous syneresis, no pigment         Fundus Exam       Right Left   Disc Pink and Sharp, Tilted disc, temporal PPA Pink and Sharp, mildly Tilted disc, temporal PPA, Compact   C/D Ratio 0.2 0.2   Macula Flat, blunted foveal reflex, mild RPE mottling, ERM with trace cystic changes centrally, No heme Flat, Good foveal reflex, mild RPE mottling, No heme or edema   Vessels attenuated, mild tortuosity mild attenuation, mild tortuosity   Periphery retina attached; IT retinotomy with good laser changes; good 360 laser; ORIGINALLY: Persistent SRF IT periphery (extending beyond laser barricade); shallow SRF temporal and superior periphery; lattice with round, atrophic holes at 1030 and 1045 within detachment, pigmented VR tuft at 0730 outside of detachment, no new RT/RD Attached, focal pigmented lattice IT quad at 0400, 0430 (with atrophic holes) and 0500 -- good laser in place, No new RT/RD/lattice           IMAGING AND PROCEDURES  Imaging and Procedures for 02/17/2024  OCT, Retina - OU - Both Eyes       Right Eye Quality was borderline. Central Foveal Thickness: 375. Progression has worsened. Findings include no SRF, abnormal foveal contour, myopic contour,  epiretinal membrane, intraretinal fluid, macular pucker, preretinal fibrosis (ERM with sharpening of nasal foveal contour and persistent central cystic changes / schisis -- slightly increased, mild PRF nasal mac).   Left Eye Quality was good. Central Foveal Thickness: 261. Progression has been stable. Findings include normal foveal contour, no IRF, no SRF, myopic contour, vitreomacular adhesion .   Notes *Images captured  and stored on drive  Diagnosis / Impression: OD: ERM with sharpening of nasal foveal contour and persistent central cystic changes / schisis -- slightly increased, mild PRF nasal mac OS: NFP, no IRF/SRF OS Myopic contour OU  Clinical management:  See below  Abbreviations: NFP - Normal foveal profile. CME - cystoid macular edema. PED - pigment epithelial detachment. IRF - intraretinal fluid. SRF - subretinal fluid. EZ - ellipsoid zone. ERM - epiretinal membrane. ORA - outer retinal atrophy. ORT - outer retinal tubulation. SRHM - subretinal hyper-reflective material. IRHM - intraretinal hyper-reflective material            ASSESSMENT/PLAN:  1. Epiretinal membrane, right eye  - ERM with sharpening of nasal foveal contour and persistent central cystic changes / schisis -- slightly increased, mild PRF nasal mac - BCVA decreased to 20/30 from 20/25 - no metamorphopsia - no indication for surgery at this time - monitor for now - f/u 4-6 months -- DFE/OCT  2. Rhegmatogenous retinal detachment, OD - detached from 11 to 8 oclock, with atrophic holes at 1030 and 1045 - s/p pneumatic retinopexy OD (12.13.21) - s/p supplemental barricade laser retinopexy OD 12.20.21  - s/p laser retinopexy w/ indirect ophthalmoscope OD 12.22.21  - s/p supplemental 100% C3F8 gas (0.1 cc), 01.03.22 - pre op exam and OCT with persistent shallow SRF in temporal periphery, greatest 8-9 oclock -- SRF extending beyond laser  - s/p 25g PPV/EL/FAX/14% C3F8 OD, 01.06.22             - doing well --  BCVA 20/25             - retina attached and in good position -- good laser changes  - +PSC OD, post vitrectomy  - f/u 1 year, RD OD, DFE, OCT  3,4. Lattice degeneration w/ atrophic holes, both eyes  - OD: lattice with atrophic holes and RD as above - OS: focal pigmented lattice IT at 0400, 0430 and 0500 --0430 with atrophic holes - s/p laser retinopexy OS (01.06.21) with good laser changes in place - no new RT/RD or lattice OU - monitor - f/u 1 year  5. 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 - discussed diagnosis and progression - monitor  Ophthalmic Meds Ordered this visit:  No orders of the defined types were placed in this encounter.    Return for 4-6 months ERM OD, DFE, OCT.  There are no Patient Instructions on file for this visit.  Explained the diagnoses, plan, and follow up with the patient and they expressed understanding.  Patient expressed understanding of the importance of proper follow up care.   This document serves as a record of services personally performed by Redell JUDITHANN Hans, MD, PhD. It was created on their behalf by Wanda GEANNIE Keens, COT an ophthalmic technician. The creation of this record is the provider's dictation and/or activities during the visit.    Electronically signed by:  Wanda GEANNIE Keens, COT  02/19/24 7:16 PM  This document serves as a record of services personally performed by Redell JUDITHANN Hans, MD, PhD. It was created on their behalf by Almetta Pesa, an ophthalmic technician. The creation of this record is the provider's dictation and/or activities during the visit.    Electronically signed by: Almetta Pesa, OA, 02/19/24  7:16 PM   Redell JUDITHANN Hans, M.D., Ph.D. Diseases & Surgery of the Retina and Vitreous [facility]   I have reviewed the above documentation for accuracy and completeness, and I agree  with the above. Redell JUDITHANN Hans, M.D., Ph.D. 02/19/24 7:21 PM   Abbreviations: M myopia (nearsighted); A astigmatism; H hyperopia (farsighted); P presbyopia; Mrx spectacle prescription;  CTL contact lenses; OD right eye; OS left eye; OU both eyes  XT exotropia; ET esotropia; PEK punctate epithelial keratitis; PEE punctate epithelial erosions; DES dry eye syndrome; MGD meibomian gland dysfunction; ATs artificial tears; PFAT's preservative free artificial tears; NSC nuclear sclerotic cataract; PSC posterior subcapsular cataract; ERM epi-retinal membrane; PVD posterior vitreous detachment; RD retinal detachment; DM diabetes mellitus; DR diabetic retinopathy; NPDR non-proliferative diabetic retinopathy; PDR proliferative diabetic retinopathy; CSME clinically significant macular edema; DME diabetic macular edema; dbh dot blot hemorrhages; CWS cotton wool spot; POAG primary open angle glaucoma; C/D cup-to-disc ratio; HVF humphrey visual field; GVF goldmann visual field; OCT optical coherence tomography; IOP intraocular pressure; BRVO Branch retinal vein occlusion; CRVO central retinal vein occlusion; CRAO central retinal artery occlusion; BRAO branch retinal artery occlusion; RT retinal tear; SB scleral buckle; PPV pars plana vitrectomy; VH Vitreous hemorrhage; PRP panretinal laser photocoagulation; IVK intravitreal kenalog ; VMT vitreomacular traction; MH Macular hole;  NVD neovascularization of the disc; NVE neovascularization elsewhere; AREDS age related eye disease study; ARMD age related macular degeneration; POAG primary open angle glaucoma; EBMD epithelial/anterior basement membrane dystrophy; ACIOL anterior chamber intraocular lens; IOL intraocular lens; PCIOL posterior chamber intraocular lens; Phaco/IOL phacoemulsification with intraocular lens placement; PRK photorefractive keratectomy; LASIK laser assisted in situ keratomileusis; HTN hypertension; DM diabetes mellitus; COPD chronic obstructive pulmonary  disease

## 2024-02-17 ENCOUNTER — Encounter (INDEPENDENT_AMBULATORY_CARE_PROVIDER_SITE_OTHER): Payer: Self-pay | Admitting: Ophthalmology

## 2024-02-17 ENCOUNTER — Ambulatory Visit (INDEPENDENT_AMBULATORY_CARE_PROVIDER_SITE_OTHER): Admitting: Ophthalmology

## 2024-02-17 DIAGNOSIS — H35371 Puckering of macula, right eye: Secondary | ICD-10-CM

## 2024-02-17 DIAGNOSIS — H3321 Serous retinal detachment, right eye: Secondary | ICD-10-CM

## 2024-02-17 DIAGNOSIS — H26491 Other secondary cataract, right eye: Secondary | ICD-10-CM | POA: Diagnosis not present

## 2024-02-17 DIAGNOSIS — H33323 Round hole, bilateral: Secondary | ICD-10-CM

## 2024-02-17 DIAGNOSIS — H35413 Lattice degeneration of retina, bilateral: Secondary | ICD-10-CM

## 2024-02-19 ENCOUNTER — Encounter (INDEPENDENT_AMBULATORY_CARE_PROVIDER_SITE_OTHER): Payer: Self-pay | Admitting: Ophthalmology

## 2024-02-24 ENCOUNTER — Encounter (HOSPITAL_BASED_OUTPATIENT_CLINIC_OR_DEPARTMENT_OTHER): Admitting: Internal Medicine

## 2024-02-24 ENCOUNTER — Other Ambulatory Visit: Payer: Self-pay | Admitting: Family Medicine

## 2024-02-24 ENCOUNTER — Other Ambulatory Visit (HOSPITAL_COMMUNITY): Payer: Self-pay

## 2024-02-24 DIAGNOSIS — E66812 Obesity, class 2: Secondary | ICD-10-CM

## 2024-02-27 ENCOUNTER — Other Ambulatory Visit (HOSPITAL_COMMUNITY): Payer: Self-pay

## 2024-02-27 ENCOUNTER — Other Ambulatory Visit: Payer: Self-pay | Admitting: Family Medicine

## 2024-03-01 ENCOUNTER — Other Ambulatory Visit (HOSPITAL_COMMUNITY): Payer: Self-pay

## 2024-03-02 ENCOUNTER — Ambulatory Visit (HOSPITAL_BASED_OUTPATIENT_CLINIC_OR_DEPARTMENT_OTHER): Attending: Family Medicine | Admitting: Internal Medicine

## 2024-03-02 DIAGNOSIS — R0683 Snoring: Secondary | ICD-10-CM | POA: Insufficient documentation

## 2024-03-13 DIAGNOSIS — R0683 Snoring: Secondary | ICD-10-CM | POA: Diagnosis not present

## 2024-03-13 NOTE — Procedures (Signed)
 [facility] [address] [place], [place] [address] Tel: [phone]   Fax: [phone]  Home Sleep Test Interpretation  Patient Name: Holland, Savannah Study Date: 03/02/2024  Date of Birth: [birth year] Study Type: HST  Age: 38 year MRN #: [id number]  Sex: Female Interpreting Physician: NEYSA RAMA, [id number]  Height: 5' 4 Referring Physician: GLENYS PRATT ([id number])  Weight: 239.0 lbs Recording Tech: Holly Neeriemer RPSGT RST  BMI: 41.3 Scoring Tech: Will Poet RRT RPSGT RST  ESS: 2 Neck Size: 14.5  %%startinterp%% %%startinterp%% Indications for Polysomnography The patient is a 38 year-old Female who is 5' 4 and weighs 239.0 lbs. Her BMI equals 41.3.  A home sleep apnea test was performed to evaluate for -.OSA  Medication  No Data.   Polysomnogram Data A home sleep test recorded the standard physiologic parameters including EKG, nasal and oral airflow.  Respiratory parameters of chest and abdominal movements were recorded with Respiratory Inductance Plethysmography belts.  Oxygen saturation was recorded by pulse oximetry.   Study Architecture The total recording time of the polysomnogram was 438.2 minutes.  The total monitoring time was 438.5 minutes.  Time spent in Supine position was 173.0 minutes.   Respiratory Events The study revealed a presence of 1 obstructive, - central, and - mixed apneas resulting in an Apnea index of 0.1 events per hour.  There were 2 hypopneas (>=3% desaturation and/or arousal) resulting in an Apnea\Hypopnea Index (AHI >=3% desaturation and/or arousal) of 0.4 events per hour.  There were 1 hypopneas (>=4% desaturation) resulting in an Apnea\Hypopnea Index (AHI >=4% desaturation) of 0.3 events per hour.  There were - Respiratory Effort Related Arousals resulting in a RERA index of - events per hour. The Respiratory Disturbance Index is 0.4 events per hour.  The snore index was - events per hour.  Mean oxygen  saturation was 97.7%.  The lowest oxygen saturation during monitoring time was 94.0%.  Time spent <=88% oxygen saturation was - minutes (-).  Cardiac Summary The average pulse rate was 76.7 bpm.  The minimum pulse rate was 61.0 bpm while the maximum pulse rate was 122.0 bpm.    Comments: Occasional apneas and hypopneas, within normal limits, AHI(3%) 0.4/hr. Oxygen desaturation to a nadir of 94%, mean 97.7%.  Diagnosis: Normal study  Recommendations: None   This study was personally reviewed and electronically signed by: - Accredited Board Certified in Sleep Medicine Date/Time: 03/13/24  11:54    %%endinterp%% %%endinterp%%    Study Overview  Recording Time: 443.4 min. Monitoring Time: 438.5 min.  Analysis Start:  09:45:45 PM Supine Time: 173.0 min.  Analysis Stop:  05:03:55 AM     Study Summary   Count Index Longest Event Duration  Apneas & Hypopneas: 3 0.4  Apneas: 15.0 sec.     Hypopneas: 59.1 sec.  RERAs: - - - sec.  Desaturations: 2 0.3 60.0 sec.  Snores: - - - sec.    Minimum Oxygen Saturation: 94.0%    Respiratory Summary   Total Duration Supine Non-Supine   Count Index Average Longest Count Index Count Index  Obstructive Apnea 1 0.1 15.0 15.0 - - 1 0.2   Mixed Apnea - - - - - - - -   Central Apnea - - - - - - - -   Total Apneas 1 0.1 15.0 15.0 - - 1 0.2            Hypopneas 3% 2 0.3 N.A. N.A. 2 0.7 - -  Apneas & Hyp. 3% 3 0.4 N.A. N.A. 2 0.7 1 0.2            Hypopneas 4% 1 0.1 N.A. N.A. 1 0.3 - -  Apneas & Hyp. 4% 2 0.3 N.A. N.A. 1 0.3 1 0.2             RERAs - - - - - - - -  RDI 3 0.4 N.A. N.A. 2 0.7 1 0.2   Oxygen Saturation Summary   Total Supine Non-Supine  Average SpO2 97.7% 97.5% 97.8%  Minimum SpO2 94.0% 95.0% 94.0%   Maximum SpO2 100.0% 100.0% 100.0%   Oxygen Saturation Distribution  Range (%) Time in range (min) Time in range (%)  90.0 - 100.0 434.6 100.0%  80.0 - 90.0 - -  70.0 - 80.0 - -  60.0 - 70.0 - -  50.0 - 60.0 - -  0.0  - 50.0 - -  Time Spent <=88% SpO2  Range (%) Time in range (min) Time in range (%)  0.0 - 88.0 - -  Cardiac Summary   Total Supine Non-Supine  Average Pulse Rate (BPM) 76.7 76.7 76.7  Minimum Pulse Rate (BPM) 61.0 62.0 61.0  Maximum Pulse Rate (BPM) 122.0 122.0 119.0                      Technologist Comments  -                        Reggy Neysa Bateman, Biomedical engineer of Sleep Medicine  ELECTRONICALLY SIGNED ON:  03/13/2024, 11:50 AM [facility] PH: [phone]   FX: [phone] ACCREDITED BY THE AMERICAN ACADEMY OF SLEEP MEDICINE

## 2024-03-13 NOTE — Procedures (Signed)
%%  startinterp%% Indications for Polysomnography The patient is a 38 year-old Female who is 5' 4 and weighs 239.0 lbs. Her BMI equals 41.3.  A home sleep apnea test was performed to evaluate for -.  MedicationNo Data. Polysomnogram Data A home sleep test recorded the standard physiologic parameters including EKG, nasal and oral airflow.  Respiratory parameters of chest and abdominal movements were recorded with Respiratory Inductance Plethysmography belts.  Oxygen saturation was  recorded by pulse oximetry.  Study Architecture The total recording time of the polysomnogram was 438.2 minutes.  The total monitoring time was 438.5 minutes.  Time spent in Supine position was 173.0 minutes.  Respiratory Events The study revealed a presence of 1 obstructive, - central, and - mixed apneas resulting in an Apnea index of 0.1 events per hour.  There were 2 hypopneas (GreaterEqual to3% desaturation and/or arousal) resulting in an Apnea\Hypopnea Index (AHI  GreaterEqual to3% desaturation and/or arousal) of 0.4 events per hour.  There were 1 hypopneas (GreaterEqual to4% desaturation) resulting in an Apnea\Hypopnea Index (AHI GreaterEqual to4% desaturation) of 0.3 events per hour.  There were - Respiratory  Effort Related Arousals resulting in a RERA index of - events per hour. The Respiratory Disturbance Index is 0.4 events per hour.  The snore index was - events per hour.  Mean oxygen saturation was 97.7%.  The lowest oxygen saturation during monitoring time was 94.0%.  Time spent LessEqual to88% oxygen saturation was  minutes ().  Cardiac Summary The average pulse rate was 76.7 bpm.  The minimum pulse rate was 61.0 bpm while the maximum pulse rate was 122.0 bpm.  Cardiac rhythm was normal/abnormal.  Comments:  Diagnosis:  Recommendations:   This study was personally reviewed and electronically signed by: - Accredited Board Certified in Sleep Medicine Date/Time:

## 2024-04-01 ENCOUNTER — Encounter: Payer: Self-pay | Admitting: Family Medicine

## 2024-05-18 ENCOUNTER — Encounter: Payer: Self-pay | Admitting: Family Medicine

## 2024-06-07 NOTE — Progress Notes (Shared)
 " [facility] - Clinic Note  06/21/2024    CHIEF COMPLAINT Patient presents for No chief complaint on file.   HISTORY OF PRESENT ILLNESS: Savannah Holland is a 38 y.o. female who presents to the clinic today for:     Pt states she's experiencing some glare but VA seems stable.   Referring physician: Fredirick Glenys RAMAN, MD [address] [place],  [place] [address]  HISTORICAL INFORMATION:   Selected notes from the MEDICAL RECORD NUMBER Referred by Dr. Overton ([facility], [address]) for eval of Retinoschisis OD   CURRENT MEDICATIONS: No current outpatient medications on file. (Ophthalmic Drugs)   No current facility-administered medications for this visit. (Ophthalmic Drugs)   Current Outpatient Medications (Other)  Medication Sig   levonorgestrel  (MIRENA ) 20 MCG/24HR IUD 1 each by Intrauterine route once.   No current facility-administered medications for this visit. (Other)   REVIEW OF SYSTEMS:   ALLERGIES No Known Allergies  PAST MEDICAL HISTORY Past Medical History:  Diagnosis Date   Allergy    GERD (gastroesophageal reflux disease)    Pulmonary embolism (HCC) 2018   finished 3 month course of Xarelto    Past Surgical History:  Procedure Laterality Date   EYE SURGERY Right    Rheg RD repair - Dr. Redell Hans   GAS INSERTION Right 06/22/2020   Procedure: INSERTION OF GAS - C3F8;  Surgeon: Hans Redell, MD;  Location: [facility] OR;  Service: Ophthalmology;  Laterality: Right;   GAS/FLUID EXCHANGE Right 06/22/2020   Procedure: GAS/FLUID EXCHANGE;  Surgeon: Hans Redell, MD;  Location: [facility] OR;  Service: Ophthalmology;  Laterality: Right;   LASER PHOTO ABLATION Left 06/22/2020   Procedure: LASER PHOTO ABLATION;  Surgeon: Hans Redell, MD;  Location: [facility] OR;  Service: Ophthalmology;  Laterality: Left;   NO PAST SURGERIES     PARS PLANA VITRECTOMY Right 06/22/2020   Procedure: PARS PLANA VITRECTOMY WITH 25 GAUGE;  Surgeon: Hans Redell, MD;   Location: [facility] OR;  Service: Ophthalmology;  Laterality: Right;   PHOTOCOAGULATION WITH LASER Right 06/22/2020   Procedure: PHOTOCOAGULATION WITH LASER;  Surgeon: Hans Redell, MD;  Location: [facility] OR;  Service: Ophthalmology;  Laterality: Right;   RETINAL DETACHMENT SURGERY Right    Dr. Redell Hans   FAMILY HISTORY Family History  Problem Relation Age of Onset   Fibroids Mother    Hyperlipidemia Father    GER disease Father    Prostate cancer Father    Breast cancer Maternal Aunt    Stroke Maternal Grandmother    Diabetes Maternal Grandmother    Frontotemporal dementia Maternal Grandmother    Diabetes Paternal Grandmother    SOCIAL HISTORY Social History   Tobacco Use   Smoking status: Never   Smokeless tobacco: Never  Vaping Use   Vaping status: Never Used  Substance Use Topics   Alcohol use: Yes    Comment: rare   Drug use: No       OPHTHALMIC EXAM:  Not recorded    IMAGING AND PROCEDURES  Imaging and Procedures for 06/21/2024          ASSESSMENT/PLAN:  1. Epiretinal membrane, right eye  - ERM with sharpening of nasal foveal contour and persistent central cystic changes / schisis -- slightly increased, mild PRF nasal mac - BCVA decreased to 20/30 from 20/25 - no metamorphopsia - no indication for surgery at this time - monitor for now - f/u 4-6 months -- DFE/OCT  2. Rhegmatogenous retinal detachment, OD -  detached from 11 to 8 oclock, with atrophic holes at 1030 and 1045 - s/p pneumatic retinopexy OD (12.13.21) - s/p supplemental barricade laser retinopexy OD 12.20.21  - s/p laser retinopexy w/ indirect ophthalmoscope OD 12.22.21  - s/p supplemental 100% C3F8 gas (0.1 cc), 01.03.22 - pre op exam and OCT with persistent shallow SRF in temporal periphery, greatest 8-9 oclock -- SRF extending beyond laser  - s/p 25g PPV/EL/FAX/14% C3F8 OD, 01.06.22             - doing well -- BCVA 20/25             - retina attached and in good position -- good laser  changes  - +PSC OD, post vitrectomy  - f/u 1 year, RD OD, DFE, OCT  3,4. Lattice degeneration w/ atrophic holes, both eyes  - OD: lattice with atrophic holes and RD as above - OS: focal pigmented lattice IT at 0400, 0430 and 0500 --0430 with atrophic holes - s/p laser retinopexy OS (01.06.21) with good laser changes in place - no new RT/RD or lattice OU - monitor - f/u 1 year  5. 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 - discussed diagnosis and progression - monitor   Ophthalmic Meds Ordered this visit:  No orders of the defined types were placed in this encounter.    No follow-ups on file.  There are no Patient Instructions on file for this visit.  Explained the diagnoses, plan, and follow up with the patient and they expressed understanding.  Patient expressed understanding of the importance of proper follow up care.   This document serves as a record of services personally performed by Redell JUDITHANN Hans, MD, PhD. It was created on their behalf by Avelina Pereyra, COA an ophthalmic technician. The creation of this record is the provider's dictation and/or activities during the visit.   Electronically signed by: Avelina GORMAN Pereyra, COT  06/21/2024  7:24 AM   This document serves as a record of services personally performed by Redell JUDITHANN Hans, MD, PhD. It was created on their behalf by Wanda GEANNIE Keens, COT an ophthalmic technician. The creation of this record is the provider's dictation and/or activities during the visit.    Electronically signed by:  Wanda GEANNIE Keens, COT  06/21/2024 7:24 AM  Redell JUDITHANN Hans, M.D., Ph.D. Diseases & Surgery of the Retina and Vitreous [facility]  Abbreviations: M myopia (nearsighted); A astigmatism; H hyperopia (farsighted); P presbyopia; Mrx spectacle prescription;  CTL contact lenses; OD right eye; OS left eye; OU  both eyes  XT exotropia; ET esotropia; PEK punctate epithelial keratitis; PEE punctate epithelial erosions; DES dry eye syndrome; MGD meibomian gland dysfunction; ATs artificial tears; PFAT's preservative free artificial tears; NSC nuclear sclerotic cataract; PSC posterior subcapsular cataract; ERM epi-retinal membrane; PVD posterior vitreous detachment; RD retinal detachment; DM diabetes mellitus; DR diabetic retinopathy; NPDR non-proliferative diabetic retinopathy; PDR proliferative diabetic retinopathy; CSME clinically significant macular edema; DME diabetic macular edema; dbh dot blot hemorrhages; CWS cotton wool spot; POAG primary open angle glaucoma; C/D cup-to-disc ratio; HVF humphrey visual field; GVF goldmann visual field; OCT optical coherence tomography; IOP intraocular pressure; BRVO Branch retinal vein occlusion; CRVO central retinal vein occlusion; CRAO central retinal artery occlusion; BRAO branch retinal artery occlusion; RT retinal tear; SB scleral buckle; PPV pars plana vitrectomy; VH Vitreous hemorrhage; PRP panretinal laser photocoagulation; IVK intravitreal kenalog ; VMT vitreomacular  traction; MH Macular hole;  NVD neovascularization of the disc; NVE neovascularization elsewhere; AREDS age related eye disease study; ARMD age related macular degeneration; POAG primary open angle glaucoma; EBMD epithelial/anterior basement membrane dystrophy; ACIOL anterior chamber intraocular lens; IOL intraocular lens; PCIOL posterior chamber intraocular lens; Phaco/IOL phacoemulsification with intraocular lens placement; PRK photorefractive keratectomy; LASIK laser assisted in situ keratomileusis; HTN hypertension; DM diabetes mellitus; COPD chronic obstructive pulmonary disease "

## 2024-06-21 ENCOUNTER — Encounter (INDEPENDENT_AMBULATORY_CARE_PROVIDER_SITE_OTHER): Admitting: Ophthalmology

## 2024-06-21 DIAGNOSIS — H3321 Serous retinal detachment, right eye: Secondary | ICD-10-CM

## 2024-06-21 DIAGNOSIS — H35371 Puckering of macula, right eye: Secondary | ICD-10-CM

## 2024-06-21 DIAGNOSIS — H33323 Round hole, bilateral: Secondary | ICD-10-CM

## 2024-06-21 DIAGNOSIS — H35413 Lattice degeneration of retina, bilateral: Secondary | ICD-10-CM

## 2024-06-21 DIAGNOSIS — H26491 Other secondary cataract, right eye: Secondary | ICD-10-CM

## 2024-06-30 ENCOUNTER — Encounter: Payer: Self-pay | Admitting: Family Medicine

## 2024-07-02 ENCOUNTER — Other Ambulatory Visit: Payer: Self-pay

## 2024-07-02 ENCOUNTER — Telehealth: Admitting: Physician Assistant

## 2024-07-02 ENCOUNTER — Other Ambulatory Visit (HOSPITAL_COMMUNITY): Payer: Self-pay

## 2024-07-02 DIAGNOSIS — L240 Irritant contact dermatitis due to detergents: Secondary | ICD-10-CM

## 2024-07-02 MED ORDER — PREDNISONE 10 MG PO TABS
ORAL_TABLET | ORAL | 0 refills | Status: AC
  Filled 2024-07-02: qty 37, 14d supply, fill #0

## 2024-07-02 NOTE — Progress Notes (Signed)
 E Visit for Rash  We are sorry that you are not feeling well. Here is how we plan to help!  Based on what you shared with me it looks like you have contact dermatitis.  Contact dermatitis is a skin rash caused by something that touches the skin and causes irritation or inflammation.  Your skin may be red, swollen, dry, cracked, and itch.  The rash should go away in a few days but can last a few weeks.  If you get a rash, it's important to figure out what caused it so the irritant can be avoided in the future. and I am prescribing a two week course of steroids (37 tablets of 10 mg prednisone ).  Days 1-4 take 4 tablets (40 mg) daily  Days 5-8 take 3 tablets (30 mg) daily, Days 9-11 take 2 tablets (20 mg) daily, Days 12-14 take 1 tablet (10 mg) daily.    HOME CARE:  Take cool showers and avoid direct sunlight. Apply cool compress or wet dressings. Take a bath in an oatmeal bath.  Sprinkle content of one Aveeno packet under running faucet with comfortably warm water .  Bathe for 15-20 minutes, 1-2 times daily.  Pat dry with a towel. Do not rub the rash. Use hydrocortisone cream. Take an antihistamine like Benadryl  for widespread rashes that itch.  The adult dose of Benadryl  is 25-50 mg by mouth 4 times daily. Caution:  This type of medication may cause sleepiness.  Do not drink alcohol, drive, or operate dangerous machinery while taking antihistamines.  Do not take these medications if you have prostate enlargement.  Read package instructions thoroughly on all medications that you take.  GET HELP RIGHT AWAY IF:  Symptoms don't go away after treatment. Severe itching that persists. If you rash spreads or swells. If you rash begins to smell. If it blisters and opens or develops a yellow-brown crust. You develop a fever. You have a sore throat. You become short of breath.  MAKE SURE YOU:  Understand these instructions. Will watch your condition. Will get help right away if you are not doing  well or get worse.  Thank you for choosing an e-visit. Your e-visit answers were reviewed by a board certified advanced clinical practitioner to complete your personal care plan. Depending upon the condition, your plan could have included both over the counter or prescription medications. Please review your pharmacy choice. Be sure that the pharmacy you have chosen is open so that you can pick up your prescription now.  If there is a problem you may message your provider in MyChart to have the prescription routed to another pharmacy. Your safety is important to us . If you have drug allergies check your prescription carefully.  For the next 24 hours, you can use MyChart to ask questions about today's visit, request a non-urgent call back, or ask for a work or school excuse from your e-visit provider. You will get an email in the next two days asking about your experience. I hope that your e-visit has been valuable and will speed your recovery.  I have spent 5 minutes in review of e-visit questionnaire, review and updating patient chart, medical decision making and response to patient.   Savannah CHRISTELLA Dickinson, PA-C

## 2024-07-06 NOTE — Progress Notes (Signed)
 " [facility] - Clinic Note  07/13/2024    CHIEF COMPLAINT Patient presents for Retina Follow Up   HISTORY OF PRESENT ILLNESS: Savannah Holland is a 39 y.o. female who presents to the clinic today for:   HPI     Retina Follow Up   Patient presents with  Other.  In right eye.  Severity is moderate.  Duration of 4 months.  Since onset it is stable.  I, the attending physician,  performed the HPI with the patient and updated documentation appropriately.        Comments   4 month Retina eval. Patient states no changes noticed. Pt got new CL rx. Pt denies FOL/Floaters. Pt does not use AT's.      Last edited by Valdemar Rogue, MD on 07/13/2024  6:21 PM.      Pt states vision seems stable, contact lens had to be stronger this year--[facility] [address]  Referring physician: Fredirick Glenys RAMAN, MD [address] [place],  [place] [address]  HISTORICAL INFORMATION:   Selected notes from the MEDICAL RECORD NUMBER Referred by Dr. Overton ([facility], [address]) for eval of Retinoschisis OD   CURRENT MEDICATIONS: No current outpatient medications on file. (Ophthalmic Drugs)   No current facility-administered medications for this visit. (Ophthalmic Drugs)   Current Outpatient Medications (Other)  Medication Sig   predniSONE  (DELTASONE ) 10 MG tablet Take 4 tablets (40 mg total) by mouth daily for 4 days, THEN 3 tablets (30 mg total) daily for 4 days, THEN 2 tablets (20 mg total) daily for 3 days, THEN 1 tablet (10 mg total) daily for 3 days.   levonorgestrel  (MIRENA ) 20 MCG/24HR IUD 1 each by Intrauterine route once.   No current facility-administered medications for this visit. (Other)   REVIEW OF SYSTEMS:   ALLERGIES No Known Allergies  PAST MEDICAL HISTORY Past Medical History:  Diagnosis Date   Allergy    GERD (gastroesophageal reflux disease)    Pulmonary embolism (HCC) 2018   finished 3 month course of Xarelto    Past Surgical  History:  Procedure Laterality Date   EYE SURGERY Right    Rheg RD repair - Dr. Rogue Valdemar   GAS INSERTION Right 06/22/2020   Procedure: INSERTION OF GAS - C3F8;  Surgeon: Valdemar Rogue, MD;  Location: [facility] OR;  Service: Ophthalmology;  Laterality: Right;   GAS/FLUID EXCHANGE Right 06/22/2020   Procedure: GAS/FLUID EXCHANGE;  Surgeon: Valdemar Rogue, MD;  Location: [facility] OR;  Service: Ophthalmology;  Laterality: Right;   LASER PHOTO ABLATION Left 06/22/2020   Procedure: LASER PHOTO ABLATION;  Surgeon: Valdemar Rogue, MD;  Location: [facility] OR;  Service: Ophthalmology;  Laterality: Left;   NO PAST SURGERIES     PARS PLANA VITRECTOMY Right 06/22/2020   Procedure: PARS PLANA VITRECTOMY WITH 25 GAUGE;  Surgeon: Valdemar Rogue, MD;  Location: [facility] OR;  Service: Ophthalmology;  Laterality: Right;   PHOTOCOAGULATION WITH LASER Right 06/22/2020   Procedure: PHOTOCOAGULATION WITH LASER;  Surgeon: Valdemar Rogue, MD;  Location: [facility] OR;  Service: Ophthalmology;  Laterality: Right;   RETINAL DETACHMENT SURGERY Right    Dr. Rogue Valdemar   FAMILY HISTORY Family History  Problem Relation Age of Onset   Fibroids Mother    Hyperlipidemia Father    GER disease Father    Prostate cancer Father    Breast cancer Maternal Aunt    Stroke Maternal Grandmother    Diabetes Maternal Grandmother    Frontotemporal dementia  Maternal Grandmother    Diabetes Paternal Grandmother    SOCIAL HISTORY Social History   Tobacco Use   Smoking status: Never   Smokeless tobacco: Never  Vaping Use   Vaping status: Never Used  Substance Use Topics   Alcohol use: Yes    Comment: rare   Drug use: No       OPHTHALMIC EXAM:  Base Eye Exam     Visual Acuity (Snellen - Linear)       Right Left   Dist [place] 20/25 -2 20/20    Correction: Contacts         Tonometry (Tonopen, 2:29 PM)       Right Left   Pressure 19 17         Pupils       Pupils Dark Light Shape React APD   Right PERRL 5 4 Round Brisk None   Left PERRL 5 4  Round Brisk None         Visual Fields       Left Right    Full Full         Extraocular Movement       Right Left    Full, Ortho Full, Ortho         Neuro/Psych     Oriented x3: Yes   Mood/Affect: Normal         Dilation     Both eyes: 1.0% Mydriacyl , 2.5% Phenylephrine  @ 2:29 PM           Slit Lamp and Fundus Exam     Slit Lamp Exam       Right Left   Lids/Lashes Dermatochalasis Dermatochalasis   Conjunctiva/Sclera mild melanosis mild melanosis   Cornea 1+ fine inferior Punctate epithelial erosions, tear film debris Clear   Anterior Chamber deep and clear Deep and quiet   Iris Round and dilated Round and reactive   Lens 1-2+ Posterior subcapsular cataract, Trace Cortical cataract Clear   Anterior Vitreous post vitrectomy; clear Vitreous syneresis, no pigment         Fundus Exam       Right Left   Disc Pink and Sharp, Tilted disc, temporal PPA Pink and Sharp, mildly Tilted disc, temporal PPA, Compact   C/D Ratio 0.2 0.2   Macula Flat, blunted foveal reflex, mild RPE mottling, ERM with trace cystic changes centrally, No heme Flat, Good foveal reflex, mild RPE mottling, No heme or edema   Vessels attenuated, mild tortuosity mild attenuation, mild tortuosity   Periphery retina attached; IT retinotomy with good laser changes; good 360 laser; ORIGINALLY: Persistent SRF IT periphery (extending beyond laser barricade); shallow SRF temporal and superior periphery; lattice with round, atrophic holes at 1030 and 1045 within detachment, pigmented VR tuft at 0730 outside of detachment, no new RT/RD Attached, focal pigmented lattice IT quad at 0400, 0430 (with atrophic holes) and 0500 -- good laser in place, No new RT/RD/lattice           IMAGING AND PROCEDURES  Imaging and Procedures for 07/13/2024  OCT, Retina - OU - Both Eyes       Right Eye Quality was borderline. Central Foveal Thickness: 377. Progression has been stable. Findings include no SRF,  abnormal foveal contour, myopic contour, epiretinal membrane, intraretinal fluid, macular pucker, preretinal fibrosis (ERM with persistent central cystic changes / schisis, mild PRF nasal mac).   Left Eye Quality was good. Central Foveal Thickness: 260. Progression has been stable. Findings include normal foveal contour,  no IRF, no SRF, myopic contour, vitreomacular adhesion .   Notes *Images captured and stored on drive  Diagnosis / Impression: OD: ERM with persistent central cystic changes / schisis, mild PRF nasal mac OS: NFP, no IRF/SRF OS Myopic contour OU  Clinical management:  See below  Abbreviations: NFP - Normal foveal profile. CME - cystoid macular edema. PED - pigment epithelial detachment. IRF - intraretinal fluid. SRF - subretinal fluid. EZ - ellipsoid zone. ERM - epiretinal membrane. ORA - outer retinal atrophy. ORT - outer retinal tubulation. SRHM - subretinal hyper-reflective material. IRHM - intraretinal hyper-reflective material             ASSESSMENT/PLAN:  1. Epiretinal membrane, right eye  - ERM with ERM with persistent central cystic changes / schisis, mild PRF nasal mac - BCVA decreased to 20/25 stable - no metamorphopsia - no indication for surgery at this time - monitor for now - f/u 6 months -- DFE/OCT  2. Rhegmatogenous retinal detachment, OD - detached from 11 to 8 oclock, with atrophic holes at 1030 and 1045 - s/p pneumatic retinopexy OD (12.13.21) - s/p supplemental barricade laser retinopexy OD 12.20.21  - s/p laser retinopexy w/ indirect ophthalmoscope OD 12.22.21  - s/p supplemental 100% C3F8 gas (0.1 cc), 01.03.22 - pre op exam and OCT with persistent shallow SRF in temporal periphery, greatest 8-9 oclock -- SRF extending beyond laser  - s/p 25g PPV/EL/FAX/14% C3F8 OD, 01.06.22             - doing well -- BCVA 20/25             - retina attached and in good position -- good laser changes  - +PSC OD, post vitrectomy  - f/u 6 months, RD  OD, DFE, OCT  3,4. Lattice degeneration w/ atrophic holes, both eyes  - OD: lattice with atrophic holes and RD as above - OS: focal pigmented lattice IT at 0400, 0430 and 0500 --0430 with atrophic holes - s/p laser retinopexy OS (01.06.21) with good laser changes in place - no new RT/RD or lattice OU - monitor - f/u 6 months  5. 1-2+ PSC Cataract OD - post-vitrectomy cataract - The symptoms of cataract, surgical options, and treatments and risks were discussed with patient. - discussed diagnosis and progression - monitor   Ophthalmic Meds Ordered this visit:  No orders of the defined types were placed in this encounter.    Return in about 6 months (around 01/10/2025) for ERM OD, DFE, OCT.  There are no Patient Instructions on file for this visit.  Explained the diagnoses, plan, and follow up with the patient and they expressed understanding.  Patient expressed understanding of the importance of proper follow up care.   This document serves as a record of services personally performed by Redell JUDITHANN Hans, MD, PhD. It was created on their behalf by Wanda GEANNIE Keens, COT an ophthalmic technician. The creation of this record is the provider's dictation and/or activities during the visit.    Electronically signed by:  Wanda GEANNIE Keens, COT  07/13/24 11:09 PM  This document serves as a record of services personally performed by Redell JUDITHANN Hans, MD, PhD. It was created on their behalf by Almetta Pesa, an ophthalmic technician. The creation of this record is the provider's dictation and/or activities during the visit.    Electronically signed by: Almetta Pesa, OA, 07/13/24  11:09 PM  Redell JUDITHANN Hans, M.D., Ph.D. Diseases & Surgery of the Retina and Vitreous [facility]  Abbreviations: M myopia (nearsighted); A astigmatism; H hyperopia (farsighted); P presbyopia; Mrx spectacle prescription;  CTL contact lenses; OD right eye; OS left eye; OU both eyes   XT exotropia; ET esotropia; PEK punctate epithelial keratitis; PEE punctate epithelial erosions; DES dry eye syndrome; MGD meibomian gland dysfunction; ATs artificial tears; PFAT's preservative free artificial tears; NSC nuclear sclerotic cataract; PSC posterior subcapsular cataract; ERM epi-retinal membrane; PVD posterior vitreous detachment; RD retinal detachment; DM diabetes mellitus; DR diabetic retinopathy; NPDR non-proliferative diabetic retinopathy; PDR proliferative diabetic retinopathy; CSME clinically significant macular edema; DME diabetic macular edema; dbh dot blot hemorrhages; CWS cotton wool spot; POAG primary open angle glaucoma; C/D cup-to-disc ratio; HVF humphrey visual field; GVF goldmann visual field; OCT optical coherence tomography; IOP intraocular pressure; BRVO Branch retinal vein occlusion; CRVO central retinal vein occlusion; CRAO central retinal artery occlusion; BRAO branch retinal artery occlusion; RT retinal tear; SB scleral buckle; PPV pars plana vitrectomy; VH Vitreous hemorrhage; PRP panretinal laser photocoagulation; IVK intravitreal kenalog ; VMT vitreomacular traction; MH Macular hole;  NVD neovascularization of the disc; NVE neovascularization elsewhere; AREDS age related eye disease study; ARMD age related macular degeneration; POAG primary open angle glaucoma; EBMD epithelial/anterior basement membrane dystrophy; ACIOL anterior chamber intraocular lens; IOL intraocular lens; PCIOL posterior chamber intraocular lens; Phaco/IOL phacoemulsification with intraocular lens placement; PRK photorefractive keratectomy; LASIK laser assisted in situ keratomileusis; HTN hypertension; DM diabetes mellitus; COPD chronic obstructive pulmonary disease "

## 2024-07-13 ENCOUNTER — Ambulatory Visit (INDEPENDENT_AMBULATORY_CARE_PROVIDER_SITE_OTHER): Admitting: Ophthalmology

## 2024-07-13 ENCOUNTER — Encounter (INDEPENDENT_AMBULATORY_CARE_PROVIDER_SITE_OTHER): Payer: Self-pay | Admitting: Ophthalmology

## 2024-07-13 DIAGNOSIS — H3321 Serous retinal detachment, right eye: Secondary | ICD-10-CM | POA: Diagnosis not present

## 2024-07-13 DIAGNOSIS — H35413 Lattice degeneration of retina, bilateral: Secondary | ICD-10-CM

## 2024-07-13 DIAGNOSIS — H33323 Round hole, bilateral: Secondary | ICD-10-CM

## 2024-07-13 DIAGNOSIS — H35371 Puckering of macula, right eye: Secondary | ICD-10-CM | POA: Diagnosis not present

## 2024-07-13 DIAGNOSIS — H26491 Other secondary cataract, right eye: Secondary | ICD-10-CM

## 2025-01-10 ENCOUNTER — Encounter (INDEPENDENT_AMBULATORY_CARE_PROVIDER_SITE_OTHER): Admitting: Ophthalmology
